# Patient Record
Sex: Female | Born: 1990 | Race: Black or African American | Hispanic: No | Marital: Single
Health system: Midwestern US, Community
[De-identification: ages and names within clinical notes are randomized; demographics above are authoritative.]

---

## 2011-10-21 IMAGING — CR RAD SPINE CERVICAL AP & LAT
3 series · 3 of 3 positions shown · non-contrast
Comparison: none

[AP (1 of 2)]
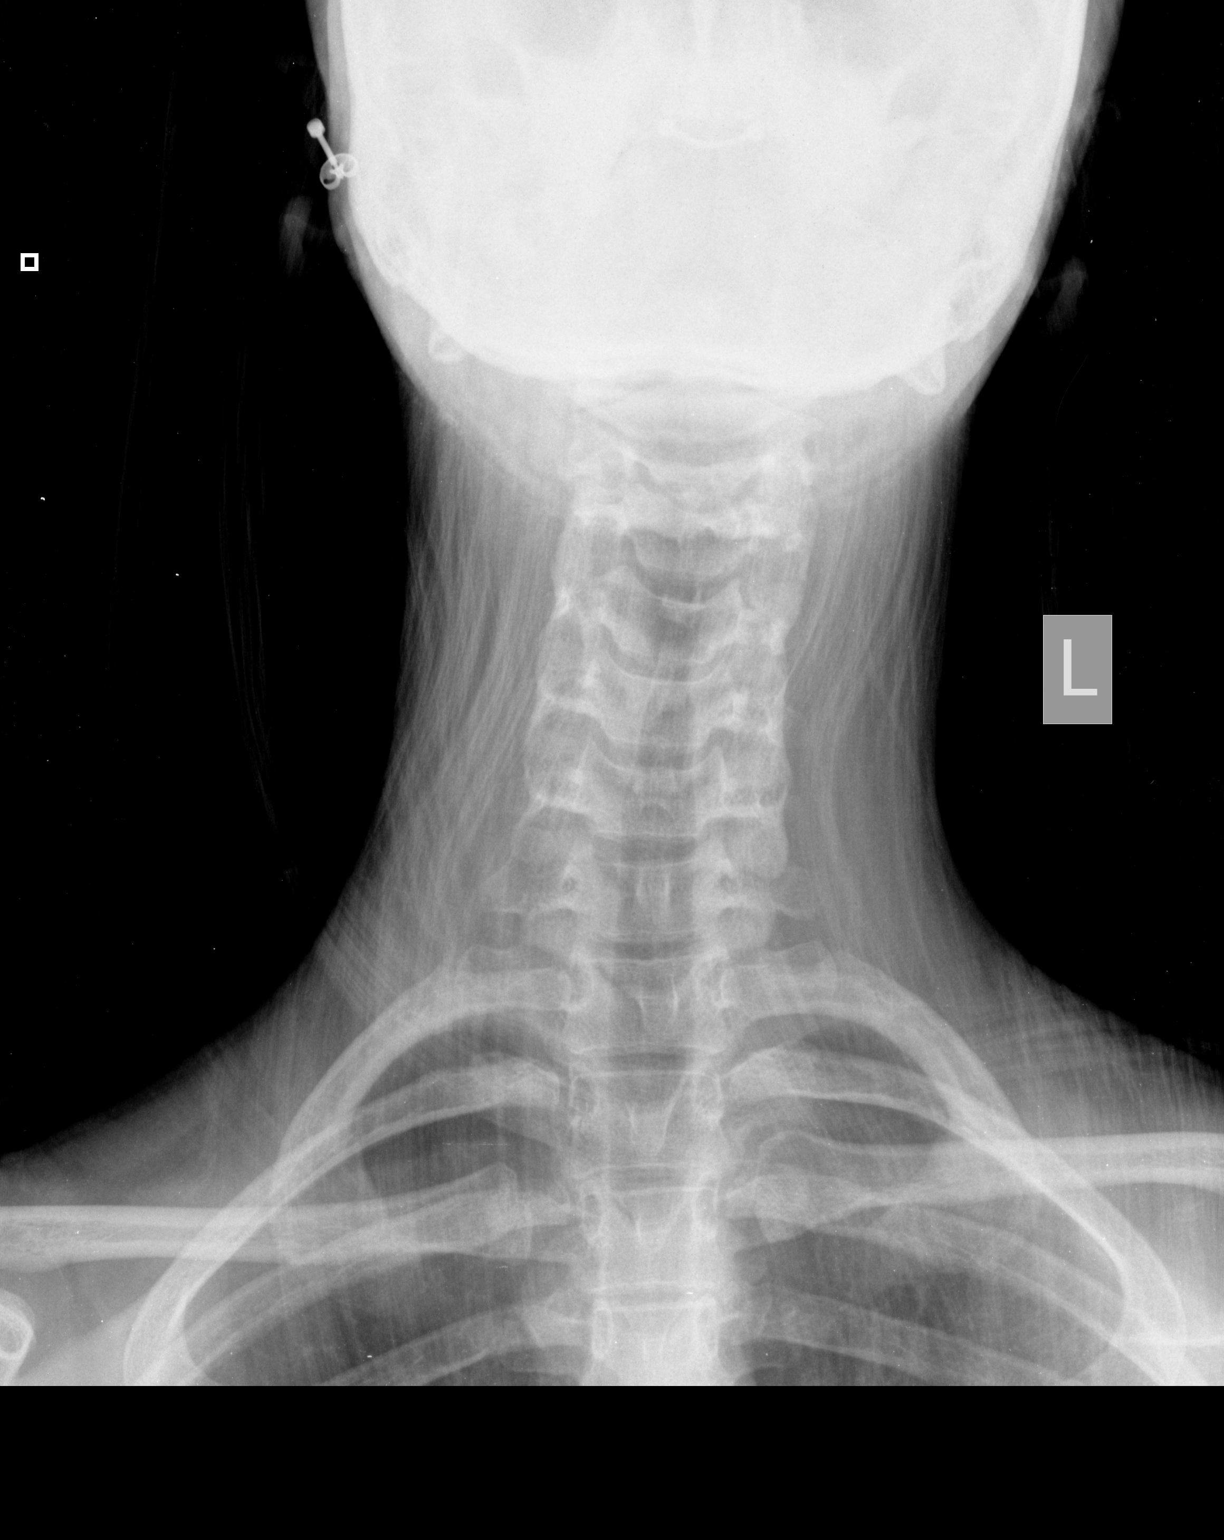

[AP (2 of 2)]
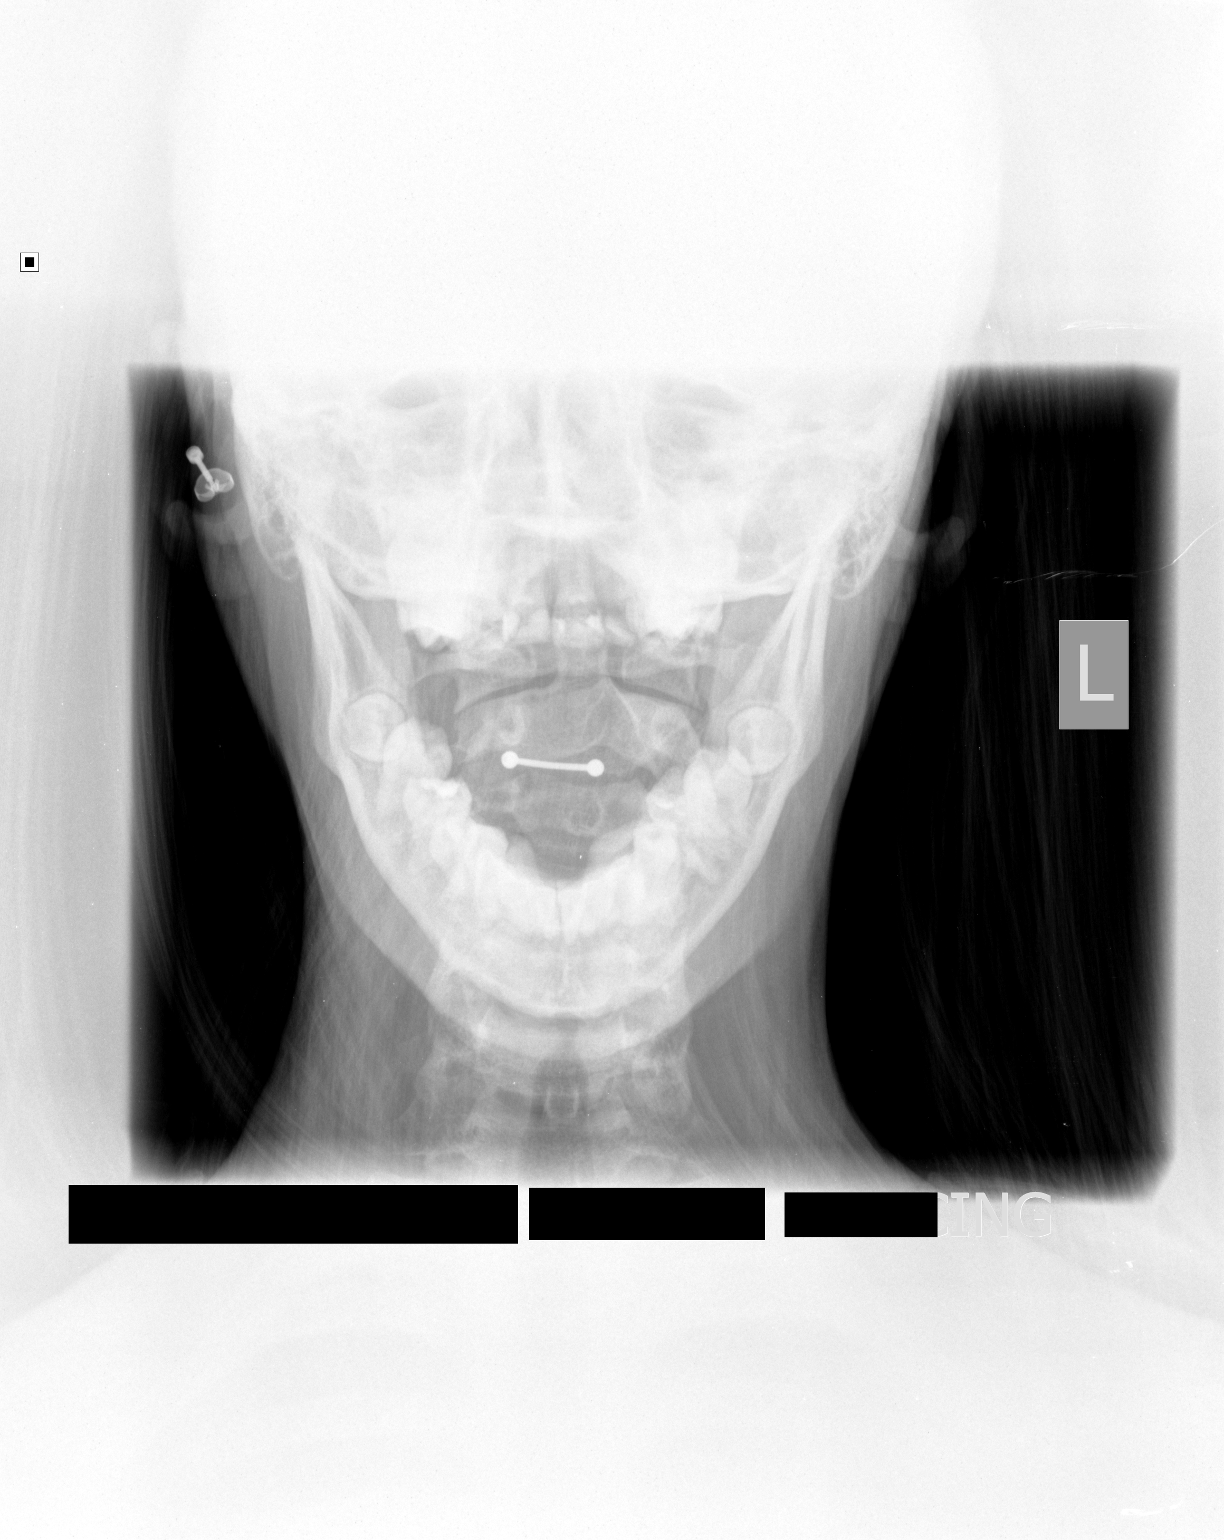

[left lateral]
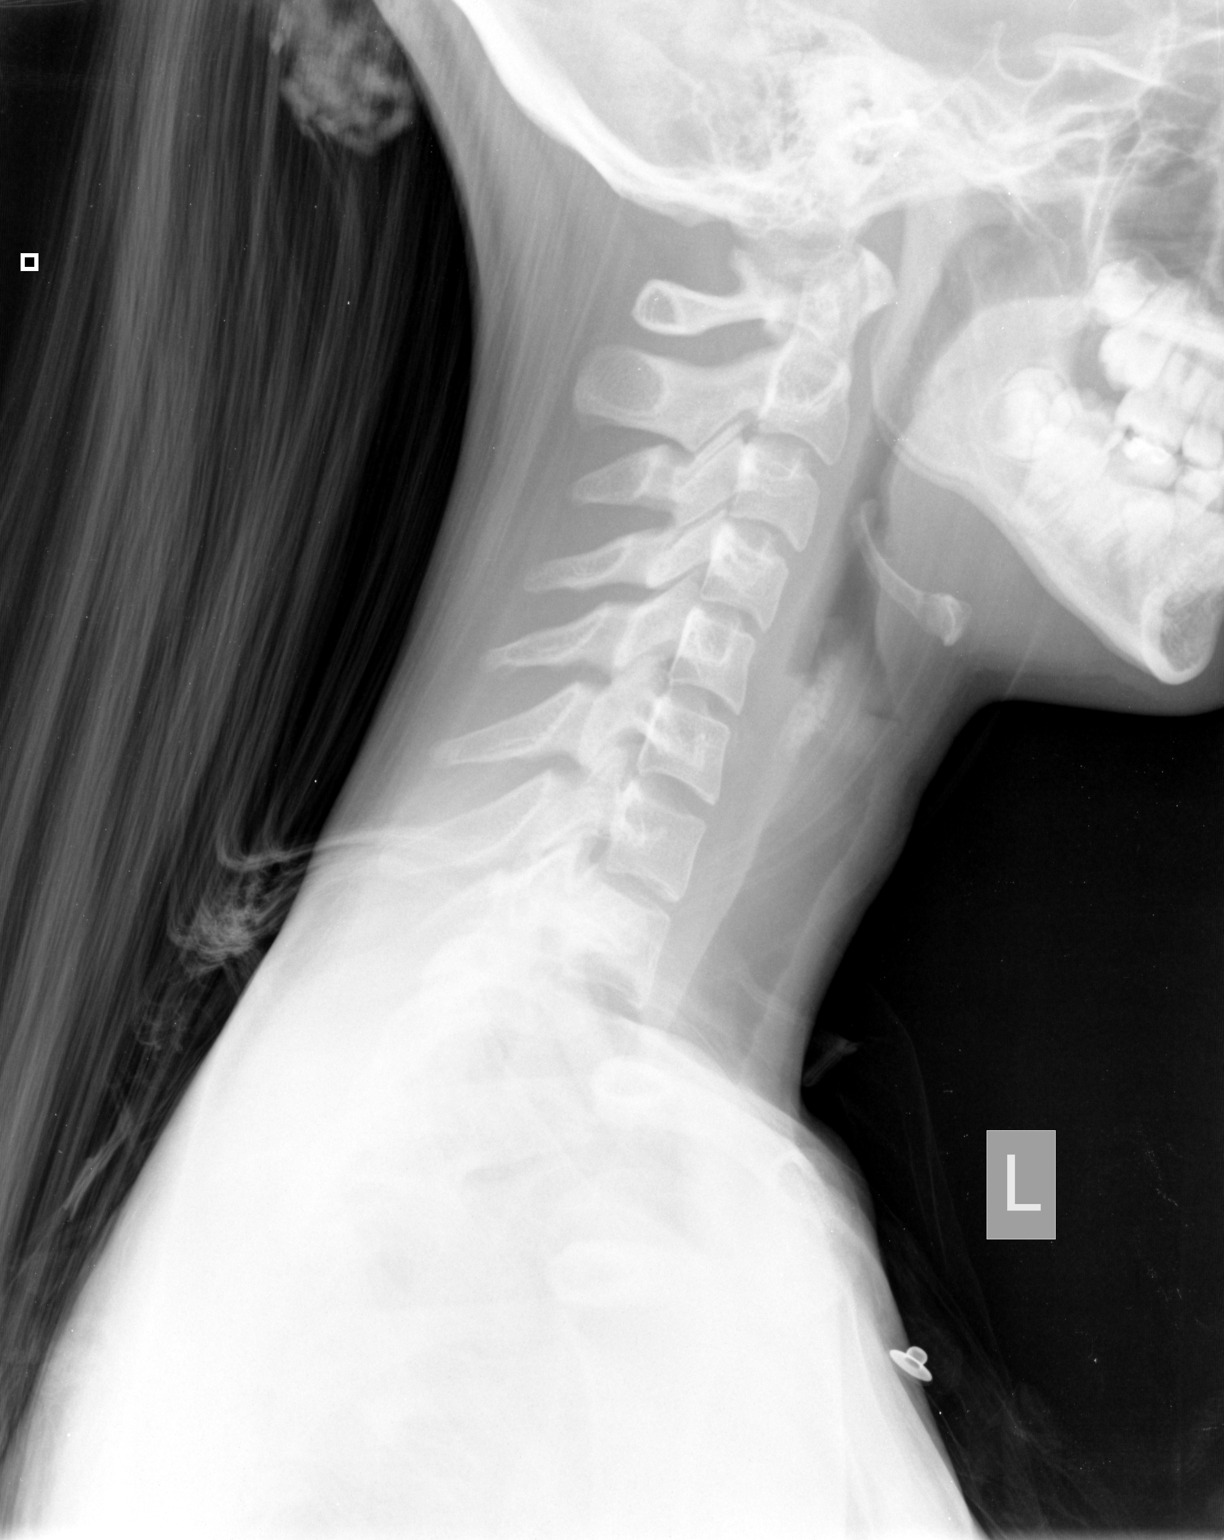

[3 of 3 positions shown; findings below may reference images not displayed]

CERVICAL SPINE-

There is no evidence of fracture, dislocation or other bony abnormality.

## 2013-05-28 NOTE — ED Notes (Signed)
Pt is [redacted] weeks pregnant and just moved here yest. Pt had prenatal care in [place] and isn't sure if she will find Dr. here or stay [place]. Pt states she had abd pain for approx 1hour this evening. States felt a "cramp". States had "small amount of blood" on tissue x1 tonight after voiding. No pain and no bleeding at this time.

## 2013-05-28 NOTE — ED Notes (Signed)
PT signed AMA and refusal of treatment form/ pt left in no distress with no pain or cramping and no vaginal drainage

## 2013-05-28 NOTE — ED Notes (Addendum)
PT refusing IV insertion and lab tests at this time after one attempt to obtain same/ Dr Theodis Aguasrope aware and speaking with patient

## 2013-05-29 LAB — URINE MICROSCOPIC

## 2013-05-29 LAB — URINALYSIS W/ RFLX MICROSCOPIC
Bilirubin: NEGATIVE
Blood: NEGATIVE
Glucose: NEGATIVE mg/dL
Ketone: NEGATIVE mg/dL
Nitrites: NEGATIVE
Protein: NEGATIVE mg/dL
Specific gravity: <1.005 — ABNORMAL LOW (ref 1.005–1.030)
Urobilinogen: 0.2 EU/dL (ref 0.1–1.0)
pH (UA): 6.5 (ref 4.5–8.0)

## 2013-05-29 MED ORDER — SODIUM CHLORIDE 0.9% BOLUS IV
0.9 % | Freq: Once | INTRAVENOUS | Status: DC
Start: 2013-05-29 — End: 2013-05-29

## 2013-05-29 MED FILL — SODIUM CHLORIDE 0.9 % IV: INTRAVENOUS | Qty: 1000

## 2013-05-29 NOTE — ED Notes (Signed)
[facility]   Dr Elsie Stainroshe will review the results and we will call pt and call in rx if needed

## 2013-05-29 NOTE — ED Notes (Signed)
Checking sensitivity so dr Elsie Stainroshe can decide on med for treatment

## 2013-05-29 NOTE — ED Provider Notes (Signed)
HPI Comments: 23 y/o female [redacted] weeks pregnant [place] and is complaining of abd cramps that resolved and episode of vaginal spotting when wiping. No weakness, no chest pain, no abd pain, no fever, no urinary complaints. Pt states she wants to go and doesn't want to wait for ob/gyn to call and does not want blood work. Here with grand mother    Patient is a 23 y.o. female presenting with pregnancy problem. The history is provided by the patient.   Pregnancy Problem  Associated symptoms include abdominal pain (resolved).        History reviewed. No pertinent past medical history.     History reviewed. No pertinent past surgical history.      History reviewed. No pertinent family history.     History     Social History   ??? Marital Status: SINGLE     Spouse Name: N/A     Number of Children: N/A   ??? Years of Education: N/A     Occupational History   ??? Not on file.     Social History Main Topics   ??? Smoking status: Never Smoker    ??? Smokeless tobacco: Not on file   ??? Alcohol Use: No   ??? Drug Use: No   ??? Sexually Active: Not on file     Other Topics Concern   ??? Not on file     Social History Narrative   ??? No narrative on file                  ALLERGIES: Review of patient's allergies indicates no known allergies.      Review of Systems   Constitutional: Negative.    HENT: Negative.    Eyes: Negative.    Respiratory: Negative.    Cardiovascular: Negative.    Gastrointestinal: Positive for abdominal pain (resolved).   Endocrine: Negative.    Genitourinary: Negative.    Musculoskeletal: Negative.    Skin: Negative.    Allergic/Immunologic: Negative.    Neurological: Negative.    Hematological: Negative.    Psychiatric/Behavioral: Negative.    All other systems reviewed and are negative.        Filed Vitals:    05/28/13 2145   BP: 98/66   Pulse: 100   Temp: 98.1 ??F (36.7 ??C)   Resp: 20   Height: 5\' 3"  (1.6 m)   Weight: 43.545 kg (96 lb)   SpO2:  100%            Physical Exam   Nursing note and vitals reviewed.  Constitutional: She is oriented to person, place, and time. She appears well-developed and well-nourished.   HENT:   Head: Normocephalic and atraumatic.   Right Ear: External ear normal.   Left Ear: External ear normal.   Eyes: Conjunctivae and EOM are normal. Pupils are equal, round, and reactive to light.   Neck: Normal range of motion. Neck supple.   Cardiovascular: Normal rate and regular rhythm.    Pulmonary/Chest: Effort normal and breath sounds normal.   Abdominal: Soft. Bowel sounds are normal.   Musculoskeletal: Normal range of motion.   Neurological: She is alert and oriented to person, place, and time.   Skin: Skin is warm.   Psychiatric: She has a normal mood and affect.        MDM    Procedures    <EMERGENCY DEPARTMENT CASE SUMMARY>  Impression/Differential Diagnosis: pregnancy    Plan: labs, observation, urine analysis/cutlure/ ultrasaound- ob/gyn consult    ED Course: unevntful    Final Impression/Diagnosis: threatened abortion/ pregnancy    Patient condition at time of disposition: stable    Pt AAO times three. Explained to pt and family member- grandmother risk, alternative, benefits fo leaving. Pt understands the risk of deatha nd or debilitation to mom and child if leaving wtihout medical evalutiona nd ob/gyn evalution. Pt and grandmother understands and still wants to gol told to f/u with ob'gyn- Dr. Jonette Pesakotikela in 24 hours. Told to return to er if symptoms worsen.Langston Masker/recur  Ria CommentShlomo Lenorris Karger, DO        I have reviewed the following home medications:    Prior to Admission medications    Medication Sig Start Date End Date Taking? Authorizing Provider   OTHER Prenatals daily   Yes Phys Other, MD         Ria CommentShlomo Longino Trefz, DO

## 2013-05-30 LAB — CULTURE, URINE

## 2013-05-31 NOTE — ED Notes (Signed)
Final urine culture report rec'd. Pt signed out AMA from ED. No antibiotics given from ED. Dr. Roshe advises Keflex $RemoveBeforeElsie [id number] [place] as requested by pt.

## 2015-07-28 ENCOUNTER — Inpatient Hospital Stay
Admit: 2015-07-28 | Discharge: 2015-07-28 | Disposition: A | Discharge status: Home / Self Care | Payer: BLUE CROSS/BLUE SHIELD | Attending: Emergency Medical Services

## 2015-07-28 DIAGNOSIS — R112 Nausea with vomiting, unspecified: Secondary | ICD-10-CM

## 2015-07-28 LAB — URINE MICROSCOPIC: Epithelial cells: >100 /hpf (ref 0–10)

## 2015-07-28 LAB — METABOLIC PANEL, COMPREHENSIVE
A-G Ratio: 1.1 (ref 1.0–1.5)
ALT (SGPT): 18 U/L (ref 12–78)
AST (SGOT): 16 U/L (ref 15–37)
Albumin: 4.1 g/dL (ref 3.4–5.0)
Alk. phosphatase: 55 U/L (ref 46–116)
Anion gap: 9 mmol/L (ref 8–20)
BUN: 8 mg/dL (ref 7–18)
Bilirubin, total: 0.7 mg/dL (ref 0.2–1.0)
CO2: 28 mmol/L (ref 21–32)
Calcium: 9 mg/dL (ref 8.5–10.1)
Chloride: 104 mmol/L (ref 98–107)
Creatinine: 0.91 mg/dL (ref 0.55–1.02)
GFR est AA: >60 mL/min/{1.73_m2} (ref >60)
GFR est non-AA: >60 mL/min/{1.73_m2} (ref >60)
Globulin: 3.9 g/dL (ref 2.5–5.0)
Glucose: 105 mg/dL (ref 74–106)
Potassium: 3.3 mmol/L — ABNORMAL LOW (ref 3.5–5.1)
Protein, total: 8 g/dL (ref 6.4–8.2)
Sodium: 141 mmol/L (ref 136–145)

## 2015-07-28 LAB — URINALYSIS W/ RFLX MICROSCOPIC
Bilirubin: NEGATIVE
Glucose: NEGATIVE mg/dL
Ketone: NEGATIVE mg/dL
Leukocyte Esterase: NEGATIVE
Nitrites: NEGATIVE
Protein: NEGATIVE mg/dL
Specific gravity: 1.01 (ref 1.005–1.030)
Urobilinogen: 0.2 EU/dL (ref 0.1–1.0)
pH (UA): 6 (ref 4.5–8.0)

## 2015-07-28 LAB — CBC WITH AUTOMATED DIFF
ABS. BASOPHILS: 0 10*3/uL (ref 0.0–0.1)
ABS. EOSINOPHILS: 0 10*3/uL (ref 0.0–0.2)
ABS. LYMPHOCYTES: 1 10*3/uL (ref 1.0–5.5)
ABS. MONOCYTES: 0.3 10*3/uL (ref 0.1–1.0)
ABS. NEUTROPHILS: 2.7 10*3/uL (ref 2.0–8.1)
BASOPHILS: 0 % (ref 0.0–1.0)
EOSINOPHILS: 0 % (ref 0.0–2.0)
HCT: 39.7 % (ref 37.0–47.0)
HGB: 13.2 g/dL (ref 12.0–16.0)
LYMPHOCYTES: 25 % (ref 20.5–51.1)
MCH: 32 PG — ABNORMAL HIGH (ref 27.0–31.0)
MCHC: 33.2 g/dL (ref 30.5–36.0)
MCV: 96.1 FL (ref 81.0–100.0)
MONOCYTES: 8 % (ref 1.7–10.0)
MPV: 10.3 FL (ref 10.2–12.7)
NEUTROPHILS: 67 % (ref 42.2–75.2)
PLATELET: 206 10*3/uL (ref 122–400)
RBC: 4.13 M/uL — ABNORMAL LOW (ref 4.20–5.40)
RDW: 11.9 % (ref 11.4–14.6)
WBC: 4 10*3/uL — ABNORMAL LOW (ref 4.8–10.8)

## 2015-07-28 LAB — HCG URINE, QL: HCG urine, QL: NEGATIVE

## 2015-07-28 MED ORDER — METRONIDAZOLE 0.75 % VAGINAL GEL
0.75 % (37.5mg/5 gram) | Freq: Every evening | VAGINAL | 0 refills | Status: AC
Start: 2015-07-28 — End: 2015-08-02

## 2015-07-28 MED ORDER — POTASSIUM CHLORIDE SR 20 MEQ TAB, PARTICLES/CRYSTALS
20 mEq | Freq: Once | ORAL | Status: AC
Start: 2015-07-28 — End: 2015-07-28
  Administered 2015-07-28: 22:00:00 via ORAL

## 2015-07-28 MED ORDER — ONDANSETRON (PF) 4 MG/2 ML INJECTION
4 mg/2 mL | INTRAMUSCULAR | Status: AC
Start: 2015-07-28 — End: 2015-07-28
  Administered 2015-07-28: 20:00:00 via INTRAVENOUS

## 2015-07-28 MED ORDER — SODIUM CHLORIDE 0.9 % IV
Freq: Once | INTRAVENOUS | Status: AC
Start: 2015-07-28 — End: 2015-07-28
  Administered 2015-07-28: 20:00:00 via INTRAVENOUS

## 2015-07-28 MED FILL — SODIUM CHLORIDE 0.9 % IV: INTRAVENOUS | Qty: 1000

## 2015-07-28 MED FILL — ONDANSETRON (PF) 4 MG/2 ML INJECTION: 4 mg/2 mL | INTRAMUSCULAR | Qty: 2

## 2015-07-28 MED FILL — KLOR-CON M20 MEQ TABLET,EXTENDED RELEASE: 20 mEq | ORAL | Qty: 1

## 2015-07-28 NOTE — ED Provider Notes (Signed)
Patient is a 25 y.o. female presenting with nausea. The history is provided by the patient.   Nausea    This is a new problem. The current episode started yesterday. The problem occurs 2 to 4 times per day. The problem has not changed since onset.The emesis has an appearance of stomach contents. There has been no fever. Pertinent negatives include no chills, no fever, no abdominal pain and no diarrhea. Associated symptoms comments: One loose stool. Risk factors include new medication (taking oral flagyl for bacterial vaginitis).        History reviewed. No pertinent past medical history.    History reviewed. No pertinent surgical history.      History reviewed. No pertinent family history.    Social History     Social History   ??? Marital status: SINGLE     Spouse name: N/A   ??? Number of children: N/A   ??? Years of education: N/A     Occupational History   ??? Not on file.     Social History Main Topics   ??? Smoking status: Never Smoker   ??? Smokeless tobacco: Not on file   ??? Alcohol use No   ??? Drug use: No   ??? Sexual activity: Not on file     Other Topics Concern   ??? Not on file     Social History Narrative         ALLERGIES: Review of patient's allergies indicates no known allergies.    Review of Systems   Constitutional: Positive for activity change, appetite change and fatigue. Negative for chills, diaphoresis and fever.   HENT: Negative.    Respiratory: Negative.    Gastrointestinal: Positive for nausea and vomiting. Negative for abdominal pain, blood in stool, constipation and diarrhea.   Endocrine: Negative.    Genitourinary: Positive for vaginal discharge. Negative for difficulty urinating, dysuria, flank pain, frequency, genital sores, hematuria and pelvic pain.        LMP 07/17/15, brownish vaginal discharge   Musculoskeletal: Negative.    Skin: Negative for color change, pallor and rash.   Allergic/Immunologic: Negative for immunocompromised state.   Neurological: Negative for dizziness and syncope.    All other systems reviewed and are negative.      Vitals:    07/28/15 1518 07/28/15 1522   BP:  108/72   Pulse:  94   Resp:  16   Temp:  98.6 ??F (37 ??C)   SpO2:  98%   Weight: 40.8 kg (90 lb)    Height:  (1.6 m)             Physical Exam   Constitutional: She is oriented to person, place, and time. She appears well-developed and well-nourished. No distress.   HENT:   Head: Normocephalic and atraumatic.   Mouth/Throat: Oropharynx is clear and moist.   Eyes: Conjunctivae and EOM are normal. Pupils are equal, round, and reactive to light. No scleral icterus.   Neck: Normal range of motion. Neck supple.   Cardiovascular: Normal rate, regular rhythm, normal heart sounds and intact distal pulses.    Pulmonary/Chest: Effort normal and breath sounds normal.   Abdominal: Soft. Normal appearance and bowel sounds are normal. She exhibits no shifting dullness, no distension, no pulsatile liver, no fluid wave, no abdominal bruit, no ascites, no pulsatile midline mass and no mass. There is no hepatosplenomegaly. There is no tenderness. There is no rigidity, no rebound, no guarding and no CVA tenderness.   Musculoskeletal: Normal  range of motion.   Neurological: She is alert and oriented to person, place, and time.   Skin: Skin is warm and dry. No rash noted. She is not diaphoretic. No erythema. No pallor.   Psychiatric: She has a normal mood and affect. Her behavior is normal. Thought content normal.   Nursing note and vitals reviewed.       MDM  Number of Diagnoses or Management Options  BV (bacterial vaginosis):   Hypokalemia:   Non-dose-related adverse reaction to medication, initial encounter:   Non-intractable vomiting with nausea, unspecified vomiting type:   Diagnosis management comments: 25 yo female with onset of n/v after taking oral flagyl for bacterial vaginitis.  No fever/pelvic pain/abdominal pain.  No urinary symptoms.  One loose stool, nonbloody.  Exam with benign  abdominal exam nontender, no guarding.  Afebrile.  Normal vital signs, afebrile.      ED Course:    Labs, IVFs, zofran, urine preg  Reassess      5:37 PM  Feeling much better  Given oral K for mild hypokalemia  Discontinue oral flagyl - transvaginal metrogel if any persistent symptoms  Progress diet as tolerated    ED Course       Procedures

## 2015-07-28 NOTE — ED Notes (Signed)
Pt reports N+V and "loose" bowel movements for about 4 days. Denies fevers. Also states that she was treated for bacterial vaginosis and was on an antibiotic which she finished 2 days ago

## 2015-07-28 NOTE — ED Notes (Signed)
Discharge instructions reviewed with patient. Patient states understanding of discharge instructions, follow up care and prescriptions. Questions encouraged and answered.

## 2015-10-10 DIAGNOSIS — E860 Dehydration: Secondary | ICD-10-CM

## 2015-10-10 NOTE — ED Triage Notes (Addendum)
Pt states she feels fatigued for the past day, pt states she has been drinking fluids but she still feels dehydrated pt states "my veins are usually puffy and they are not as big now". Pt is alert and oriented x4.

## 2015-10-11 ENCOUNTER — Inpatient Hospital Stay
Admit: 2015-10-11 | Discharge: 2015-10-11 | Disposition: A | Discharge status: Home / Self Care | Payer: BLUE CROSS/BLUE SHIELD | Attending: Emergency Medicine

## 2015-10-11 LAB — CBC WITH AUTOMATED DIFF
ABS. BASOPHILS: 0 10*3/uL (ref 0.0–0.1)
ABS. EOSINOPHILS: 0 10*3/uL (ref 0.0–0.2)
ABS. LYMPHOCYTES: 2.3 10*3/uL (ref 1.0–5.5)
ABS. MONOCYTES: 0.5 10*3/uL (ref 0.1–1.0)
ABS. NEUTROPHILS: 3.6 10*3/uL (ref 2.0–8.1)
BASOPHILS: 0 % (ref 0.0–1.0)
EOSINOPHILS: 1 % (ref 0.0–2.0)
HCT: 35.4 % — ABNORMAL LOW (ref 37.0–47.0)
HGB: 11.8 g/dL — ABNORMAL LOW (ref 12.0–16.0)
LYMPHOCYTES: 35 % (ref 20.5–51.1)
MCH: 32.6 PG — ABNORMAL HIGH (ref 27.0–31.0)
MCHC: 33.3 g/dL (ref 30.5–36.0)
MCV: 97.8 FL (ref 81.0–100.0)
MONOCYTES: 8 % (ref 1.7–10.0)
MPV: 10.7 FL (ref 10.2–12.7)
NEUTROPHILS: 56 % (ref 42.2–75.2)
PLATELET: 186 10*3/uL (ref 122–400)
RBC: 3.62 M/uL — ABNORMAL LOW (ref 4.20–5.40)
RDW: 11.5 % (ref 11.4–14.6)
WBC: 6.4 10*3/uL (ref 4.8–10.8)

## 2015-10-11 LAB — URINE MICROSCOPIC

## 2015-10-11 LAB — URINALYSIS W/ RFLX MICROSCOPIC
Bilirubin: NEGATIVE
Glucose: NEGATIVE mg/dL
Ketone: NEGATIVE mg/dL
Leukocyte Esterase: NEGATIVE
Nitrites: NEGATIVE
Protein: NEGATIVE mg/dL
Specific gravity: <1.005 — ABNORMAL LOW (ref 1.005–1.030)
Urobilinogen: 0.2 EU/dL (ref 0.1–1.0)
pH (UA): 6.5 (ref 4.5–8.0)

## 2015-10-11 LAB — METABOLIC PANEL, COMPREHENSIVE
A-G Ratio: 1.1 (ref 1.0–1.5)
ALT (SGPT): 19 U/L (ref 12–78)
AST (SGOT): 16 U/L (ref 15–37)
Albumin: 3.8 g/dL (ref 3.4–5.0)
Alk. phosphatase: 48 U/L (ref 46–116)
Anion gap: 9 mmol/L (ref 8–20)
BUN: 9 mg/dL (ref 7–18)
Bilirubin, total: 1.1 mg/dL — ABNORMAL HIGH (ref 0.2–1.0)
CO2: 28 mmol/L (ref 21–32)
Calcium: 9 mg/dL (ref 8.5–10.1)
Chloride: 106 mmol/L (ref 98–107)
Creatinine: 0.79 mg/dL (ref 0.55–1.02)
GFR est AA: >60 mL/min/{1.73_m2} (ref >60)
GFR est non-AA: >60 mL/min/{1.73_m2} (ref >60)
Globulin: 3.4 g/dL (ref 2.5–5.0)
Glucose: 91 mg/dL (ref 74–106)
Potassium: 3.7 mmol/L (ref 3.5–5.1)
Protein, total: 7.2 g/dL (ref 6.4–8.2)
Sodium: 142 mmol/L (ref 136–145)

## 2015-10-11 LAB — MAGNESIUM: Magnesium: 2 mg/dL (ref 1.8–2.4)

## 2015-10-11 LAB — HCG URINE, QL: HCG urine, QL: NEGATIVE

## 2015-10-11 MED ORDER — SODIUM CHLORIDE 0.9 % IJ SYRG
Freq: Three times a day (TID) | INTRAMUSCULAR | Status: DC
Start: 2015-10-11 — End: 2015-10-11
  Administered 2015-10-11: 05:00:00 via INTRAVENOUS

## 2015-10-11 MED ORDER — SODIUM CHLORIDE 0.9 % IJ SYRG
INTRAMUSCULAR | Status: DC | PRN
Start: 2015-10-11 — End: 2015-10-11

## 2015-10-11 MED ORDER — ONDANSETRON (PF) 4 MG/2 ML INJECTION
4 mg/2 mL | Freq: Once | INTRAMUSCULAR | Status: DC
Start: 2015-10-11 — End: 2015-10-11

## 2015-10-11 MED ORDER — SODIUM CHLORIDE 0.9% BOLUS IV
0.9 % | Freq: Once | INTRAVENOUS | Status: AC
Start: 2015-10-11 — End: 2015-10-11
  Administered 2015-10-11: 05:00:00 via INTRAVENOUS

## 2015-10-11 MED ORDER — SODIUM CHLORIDE 0.9 % IV
INTRAVENOUS | Status: DC
Start: 2015-10-11 — End: 2015-10-11

## 2015-10-11 MED ORDER — ACETAMINOPHEN 325 MG TABLET
325 mg | ORAL | Status: AC
Start: 2015-10-11 — End: 2015-10-11
  Administered 2015-10-11: 05:00:00 via ORAL

## 2015-10-11 MED FILL — NORMAL SALINE FLUSH 0.9 % INJECTION SYRINGE: INTRAMUSCULAR | Qty: 10

## 2015-10-11 MED FILL — TYLENOL 325 MG TABLET: 325 mg | ORAL | Qty: 2

## 2015-10-11 MED FILL — SODIUM CHLORIDE 0.9 % IV: INTRAVENOUS | Qty: 1000

## 2015-10-11 MED FILL — SODIUM CHLORIDE 0.9 % IV: INTRAVENOUS | Qty: 500

## 2015-10-11 MED FILL — ONDANSETRON (PF) 4 MG/2 ML INJECTION: 4 mg/2 mL | INTRAMUSCULAR | Qty: 2

## 2015-10-11 NOTE — ED Provider Notes (Addendum)
HPI Comments: This patient presents with a h/o feeling some generalized weakness, fatigue, nausea, and not eating or drinking as much as usual because she has been working so much. She feels thirsty and dehydrated with a slight dull headache. She has been able to tolerate fluids and no vomiting, diarrhea, fever chills or chest or abdominal pain. No photophobia, neck pain or stiffness or rash.  No recent travel.  She was given a liter of IV fluids at an urgent care yesterday.     Patient is a 25 y.o. female presenting with dehydration. The history is provided by the patient.   Dehydration   Associated symptoms include headaches. Pertinent negatives include no chest pain, no abdominal pain and no shortness of breath.        History reviewed. No pertinent past medical history.    Past Surgical History:   Procedure Laterality Date   ??? HX GYN      vaginal birth 2 years ago         History reviewed. No pertinent family history.    Social History     Social History   ??? Marital status: SINGLE     Spouse name: N/A   ??? Number of children: N/A   ??? Years of education: N/A     Occupational History   ??? Not on file.     Social History Main Topics   ??? Smoking status: Never Smoker   ??? Smokeless tobacco: Not on file   ??? Alcohol use Yes      Comment: socially   ??? Drug use: No   ??? Sexual activity: Not on file     Other Topics Concern   ??? Not on file     Social History Narrative         ALLERGIES: Review of patient's allergies indicates no known allergies.    Review of Systems   Constitutional: Positive for activity change (working more than usual) and fatigue. Negative for chills and fever.   HENT: Negative for rhinorrhea and sore throat.    Eyes: Negative for photophobia, pain and visual disturbance.   Respiratory: Negative for cough, chest tightness and shortness of breath.    Cardiovascular: Negative for chest pain and palpitations.   Gastrointestinal: Positive for nausea. Negative for abdominal distention,  abdominal pain, anal bleeding, blood in stool, diarrhea and vomiting.   Genitourinary: Negative for dysuria, flank pain, frequency, hematuria and urgency.   Musculoskeletal: Negative for back pain, neck pain and neck stiffness.   Neurological: Positive for light-headedness and headaches. Negative for dizziness, syncope and numbness.   Psychiatric/Behavioral: Negative for confusion and decreased concentration.       Vitals:    10/10/15 2315 10/10/15 2316   BP:  118/80   Pulse:  84   Resp:  18   Temp:  98.3 ??F (36.8 ??C)   SpO2:  100%   Weight: 40.8 kg (90 lb)    Height:  (1.6 m)             Physical Exam   Constitutional: She is oriented to person, place, and time. She appears well-developed and well-nourished. No distress.   HENT:   Nose: Nose normal.   Mouth/Throat: Oropharynx is clear and moist. No oropharyngeal exudate.   Mucous membranes moist tongue moist   No hypopharyngeal erythema   Eyes: Conjunctivae are normal. Right eye exhibits no discharge. Left eye exhibits no discharge. No scleral icterus.   Neck: Neck supple. No JVD present. No  tracheal deviation present.   Cardiovascular: Normal rate, regular rhythm and normal heart sounds.  Exam reveals no gallop and no friction rub.    No murmur heard.  Pulmonary/Chest: Effort normal and breath sounds normal. No stridor. No respiratory distress. She has no wheezes. She has no rales. She exhibits no tenderness.   Abdominal: Soft. Bowel sounds are normal. She exhibits no distension and no mass. There is no tenderness. There is no rebound and no guarding.   Genitourinary:   Genitourinary Comments: No cva or flank tenderness   Musculoskeletal: She exhibits no edema or tenderness.   Lymphadenopathy:     She has no cervical adenopathy.   Neurological: She is alert and oriented to person, place, and time.   Skin: Skin is warm and dry. No rash noted. She is not diaphoretic. No erythema. No pallor.   Psychiatric: She has a normal mood and affect.    Nursing note and vitals reviewed.       MDM  ED Course       Procedures    Labs Reviewed   CBC WITH AUTOMATED DIFF - Abnormal; Notable for the following:        Result Value    RBC 3.62 (*)     HGB 11.8 (*)     HCT 35.4 (*)     MCH 32.6 (*)     All other components within normal limits   METABOLIC PANEL, COMPREHENSIVE - Abnormal; Notable for the following:     Bilirubin, total 1.1 (*)     All other components within normal limits   URINALYSIS W/ RFLX MICROSCOPIC - Abnormal; Notable for the following:     Specific gravity <1.005 (*)     Blood TRACE (*)     All other components within normal limits   MAGNESIUM   HCG URINE, QL   URINE MICROSCOPIC     She has no nausea now and does not want the zofran.     <EMERGENCY DEPARTMENT CASE SUMMARY>    Impression/Differential Diagnosis: dehydration consider electrolyte imbalance, anemia, pregnancy    Plan: exam, work up as ordered, re-evaluation    ED Course: improved    Final Impression/Diagnosis: mild dehydration    Patient condition at time of disposition: stable      I have reviewed the following home medications:    Prior to Admission medications    Not on File         Collier Salinaobert E Hennessy Bartel, MD

## 2015-10-11 NOTE — ED Notes (Signed)
Patient is awake, alert, and oriented, speech is clear and patient is able to ambulate and ready for discharge. Verbal and written discharge instructions provided and has the cognitive understanding of discharge instructions. Discharged home with family. All questions answered.

## 2015-12-05 ENCOUNTER — Inpatient Hospital Stay
Admit: 2015-12-05 | Discharge: 2015-12-05 | Disposition: A | Discharge status: Home / Self Care | Payer: BLUE CROSS/BLUE SHIELD | Attending: Emergency Medicine

## 2015-12-05 DIAGNOSIS — K0889 Other specified disorders of teeth and supporting structures: Secondary | ICD-10-CM

## 2015-12-05 MED ORDER — IBUPROFEN 800 MG TAB
800 mg | ORAL | Status: AC
Start: 2015-12-05 — End: 2015-12-05
  Administered 2015-12-05: 18:00:00 via ORAL

## 2015-12-05 MED ORDER — PENICILLIN V-K 500 MG TAB
500 mg | ORAL_TABLET | Freq: Four times a day (QID) | ORAL | 0 refills | Status: AC
Start: 2015-12-05 — End: 2015-12-12

## 2015-12-05 MED ORDER — PENICILLIN V-K 500 MG TAB
500 mg | ORAL | Status: AC
Start: 2015-12-05 — End: 2015-12-05
  Administered 2015-12-05: 18:00:00 via ORAL

## 2015-12-05 MED FILL — IBUPROFEN 800 MG TAB: 800 mg | ORAL | Qty: 1

## 2015-12-05 MED FILL — PENICILLIN V-K 500 MG TAB: 500 mg | ORAL | Qty: 1

## 2015-12-05 NOTE — ED Notes (Signed)
Patient is awake, alert, and oriented, speech is clear and patient is able to ambulate (if applicable) and ready for discharge. Verbal and written discharge instructions provided and has the cognitive understanding of discharge instructions. Discharged home with family. All questions answered.

## 2015-12-05 NOTE — ED Provider Notes (Signed)
HPI Comments: PT WITH PAIN LEFT LOWER JAW FOR THE PAST 3 DAYS, THINKS SHE CRACKED A TOOTH. NO FEVER, CHILLS, CP, SOB. PAIN RADIATED TO LEFT EAR AND NECK. NO DIFFICULTY BREATHING SPEAKING OR SWALLOWING. FELT SOB LAST NIGHT, VERY STRESSED, SINGLE MOM    Patient is a 25 y.o. female presenting with jaw pain. The history is provided by the patient.   Jaw Pain   This is a new problem. The current episode started more than 2 days ago. The problem has not changed since onset.Pertinent negatives include no chest pain, no abdominal pain, no headaches and no shortness of breath. Nothing aggravates the symptoms.        History reviewed. No pertinent past medical history.    Past Surgical History:   Procedure Laterality Date   ??? HX GYN      vaginal birth 2 years ago         History reviewed. No pertinent family history.    Social History     Social History   ??? Marital status: SINGLE     Spouse name: N/A   ??? Number of children: N/A   ??? Years of education: N/A     Occupational History   ??? Not on file.     Social History Main Topics   ??? Smoking status: Never Smoker   ??? Smokeless tobacco: Never Used   ??? Alcohol use Yes      Comment: socially   ??? Drug use: No   ??? Sexual activity: Not on file     Other Topics Concern   ??? Not on file     Social History Narrative         ALLERGIES: Review of patient's allergies indicates no known allergies.    Review of Systems   Constitutional: Negative.  Negative for fever.   HENT: Negative.    Eyes: Negative.    Respiratory: Negative.  Negative for cough and shortness of breath.    Cardiovascular: Negative.  Negative for chest pain.   Gastrointestinal: Negative.  Negative for abdominal pain.   Genitourinary: Negative.    Musculoskeletal: Negative.    Skin: Negative.  Negative for rash.   Neurological: Negative.  Negative for headaches.   Psychiatric/Behavioral: Negative.    All other systems reviewed and are negative.      Vitals:    12/05/15 1341   BP: 94/67   Pulse: 84   Resp: 16    Temp: 99.2 ??F (37.3 ??C)   SpO2: 98%   Weight: 36.3 kg (80 lb)   Height: 5\' 3"  (1.6 m)            Physical Exam   Constitutional: She is oriented to person, place, and time. She appears well-developed and well-nourished.   HENT:   Head: Normocephalic and atraumatic.   Right Ear: External ear normal.   Left Ear: Tympanic membrane and external ear normal.   Nose: Nose normal.   Mouth/Throat: Uvula is midline, oropharynx is clear and moist and mucous membranes are normal. No trismus in the jaw. No uvula swelling. No oropharyngeal exudate, posterior oropharyngeal edema, posterior oropharyngeal erythema or tonsillar abscesses.       TTP LEFT LOWER POSTERIOR MOLAR/IMPACTED WISDOM TOOTH, NO GINGIVAL ABSCESS, DRAINAGE OR FACIAL SWELLING  R TM NOT SEEN - CERUMEN IMPACTION   Eyes: Conjunctivae are normal. Pupils are equal, round, and reactive to light.   Neck: Normal range of motion and full passive range of motion without pain. Neck supple. No  spinous process tenderness and no muscular tenderness present. Normal range of motion present.   Cardiovascular: Normal rate, regular rhythm and normal heart sounds.    Pulmonary/Chest: Effort normal.   Musculoskeletal: Normal range of motion.   Lymphadenopathy:        Head (left side): No submental, no submandibular, no preauricular, no posterior auricular and no occipital adenopathy present.     She has no cervical adenopathy.   THIN NECK APPEARS NORMAL, NON TENDER, NO ADENOPATHY   Neurological: She is alert and oriented to person, place, and time.   Skin: Skin is warm and dry.   Nursing note and vitals reviewed.       MDM  ED Course       Procedures      DENTAL PAIN WILL COVER WITH PCN, NSAIDS  F/U DENTAL RTC IF WORSE    <EMERGENCY DEPARTMENT CASE SUMMARY>    Impression/Differential Diagnosis: DENTAL PAIN, JAW PAIN, EARACHE    Plan: EXAM    ED Course: UNREMARKABLE    Final Impression/Diagnosis:   1. Pain, dental          Patient condition at time of disposition: STABLE       I have reviewed the following home medications:    Prior to Admission medications    Not on File         Andreas Blower, MD

## 2015-12-05 NOTE — ED Triage Notes (Signed)
Patient arrives to the ED with complaint of left jaw and neck pain x 3 days. Possible tooth issue.

## 2016-01-21 ENCOUNTER — Inpatient Hospital Stay
Admit: 2016-01-21 | Discharge: 2016-01-22 | Disposition: A | Discharge status: Home / Self Care | Payer: Self-pay | Attending: Emergency Medical Services

## 2016-01-21 ENCOUNTER — Emergency Department: Admit: 2016-01-21 | Payer: Self-pay | Primary: Family

## 2016-01-21 DIAGNOSIS — S0033XA Contusion of nose, initial encounter: Secondary | ICD-10-CM
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
## 2016-01-21 NOTE — ED Notes (Signed)
Patient is awake, alert, and oriented, speech is clear and patient is able to ambulate (if applicable) and ready for discharge. Verbal and written discharge instructions provided and has the cognitive understanding of discharge instructions. Discharged home with family. All questions answered. Pt discharged  No rx sent
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
## 2016-01-30 DIAGNOSIS — R51 Headache: Secondary | ICD-10-CM
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
## 2016-01-30 NOTE — ED Notes (Signed)
Patient is awake, alert, and oriented, speech is clear and patient is able to ambulate (if applicable) and ready for discharge. Verbal and written discharge instructions provided and has the cognitive understanding of discharge instructions. Discharged home with family. All questions answered.

## 2016-01-30 NOTE — ED Provider Notes (Signed)
HPI Comments:   Patient here for headache and post neck pain for couple days . Patient links her headache to a nose injury she had 10 days ago.. She took Motrin earlier today with some relief. No n/V, no Photophobia. She also reports  Feeling cold in her scalp. She is 84 lbs .    The history is provided by the patient. No language interpreter was used.        History reviewed. No pertinent past medical history.    Past Surgical History:   Procedure Laterality Date   ??? HX GYN      vaginal birth 2 years ago         History reviewed. No pertinent family history.    Social History     Social History   ??? Marital status: SINGLE     Spouse name: N/A   ??? Number of children: N/A   ??? Years of education: N/A     Occupational History   ??? Not on file.     Social History Main Topics   ??? Smoking status: Never Smoker   ??? Smokeless tobacco: Never Used   ??? Alcohol use Yes      Comment: socially   ??? Drug use: No   ??? Sexual activity: Not on file     Other Topics Concern   ??? Not on file     Social History Narrative         ALLERGIES: Review of patient's allergies indicates no known allergies.    Review of Systems   Respiratory: Negative for shortness of breath.    Cardiovascular: Negative for chest pain.   Endocrine:        Feels cold in the head   Neurological: Positive for headaches.   All other systems reviewed and are negative.      Vitals:    01/30/16 2151   BP: 107/74   Pulse: 95   Resp: 16   Temp: 98.2 ??F (36.8 ??C)   SpO2: 99%   Weight: 38.6 kg (85 lb)   Height: 5\' 3"  (1.6 m)            Physical Exam   Constitutional: She is oriented to person, place, and time. She appears well-developed and well-nourished. No distress.   Frail young girl. Underweight   HENT:   Head: Normocephalic and atraumatic.   Eyes: EOM are normal. Pupils are equal, round, and reactive to light.   Mild pallor of conjunctiva   Neck: Normal range of motion. Neck supple.   Cardiovascular: Normal rate and regular rhythm.     Pulmonary/Chest: Effort normal and breath sounds normal.   Abdominal: Soft. Bowel sounds are normal.   Musculoskeletal: Normal range of motion.   Neurological: She is alert and oriented to person, place, and time.   Skin: Skin is warm and dry.   Nursing note and vitals reviewed.       MDM  ED Course       Procedures    <EMERGENCY DEPARTMENT CASE SUMMARY>    Impression/Differential Diagnosis:   Underweight , r/o anemia , thyroid issues    Plan: Went to patient's bedside. Results discussed with patient. Patient is in NAD, resting comfortably at this time. Will discharge patient home in stable condition.       I instructed patient to f-up with Dr Vonzell Schlatter crystal run for outpatient eval      ED Course:   Orders Placed This Encounter   ??? acetaminophen (TYLENOL)  tablet 500 mg         Final Impression/Diagnosis:   1. Nonintractable headache, unspecified chronicity pattern, unspecified headache type          Patient condition at time of disposition:  fair      I have reviewed the following home medications:    Prior to Admission medications    Not on File         Lenis DickinsonPany Ladarryl Wrage, MD

## 2016-01-31 ENCOUNTER — Inpatient Hospital Stay
Admit: 2016-01-31 | Discharge: 2016-01-31 | Disposition: A | Discharge status: Home / Self Care | Payer: BLUE CROSS/BLUE SHIELD | Attending: Geriatric Medicine

## 2016-01-31 MED ORDER — ACETAMINOPHEN 500 MG TAB
500 mg | ORAL | Status: AC
Start: 2016-01-31 — End: 2016-01-30
  Administered 2016-01-31: 02:00:00 via ORAL

## 2016-01-31 MED FILL — TYLENOL EXTRA STRENGTH 500 MG TABLET: 500 mg | ORAL | Qty: 1

## 2016-05-14 ENCOUNTER — Inpatient Hospital Stay
Admit: 2016-05-14 | Discharge: 2016-05-15 | Disposition: A | Discharge status: Home / Self Care | Payer: BLUE CROSS/BLUE SHIELD | Attending: Emergency Medicine

## 2016-05-14 ENCOUNTER — Emergency Department: Admit: 2016-05-15 | Payer: BLUE CROSS/BLUE SHIELD | Primary: Family

## 2016-05-14 DIAGNOSIS — R0789 Other chest pain: Secondary | ICD-10-CM

## 2016-05-14 NOTE — ED Triage Notes (Signed)
Pt sts ever since she had her flu shot on 9/29 she has a little ches tpain and sts her period is 3 days late and would like a pregnancy test   Urine sample given   Pt smiling and comfortable  Denies sob or any diff breathing or moving   sts didn't see a doctor

## 2016-05-14 NOTE — ED Notes (Signed)
Patient is awake, alert, and oriented, speech is clear and patient is able to ambulate (if applicable) and ready for discharge. Verbal and written discharge instructions provided and has the cognitive understanding of discharge instructions. Discharged home with family. All questions answered. Pt discharged  No rx sent   Pt amb out of er with child

## 2016-05-14 NOTE — ED Provider Notes (Signed)
HPI Comments: This patient presents to the ED with a h/o having some aching right upper chest pain ever since she had a flu shot on 02/08/16.  Sometimes it is over the left mid to upper chest as well.  The pain is worse with movement and pressing on the area.  She denies SOB, nausea, vomiting diaphoresis, cough, sore throat, fever, chills, or h/o trauma. She also states her period has been three days late and is requesting a pregnancy test.  She  Has some mild nasal congestion. No h/o heart disease, diabetes, leg pain or swelling or h/o trauma.     Patient is a 26 y.o. female presenting with morning sickness. The history is provided by the patient.   Morning Sickness   Pertinent negatives include no chest pain, no abdominal pain, no headaches and no shortness of breath.        History reviewed. No pertinent past medical history.    Past Surgical History:   Procedure Laterality Date   ??? HX GYN      vaginal birth 2 years ago         History reviewed. No pertinent family history.    Social History     Social History   ??? Marital status: SINGLE     Spouse name: N/A   ??? Number of children: N/A   ??? Years of education: N/A     Occupational History   ??? Not on file.     Social History Main Topics   ??? Smoking status: Never Smoker   ??? Smokeless tobacco: Never Used   ??? Alcohol use Yes      Comment: socially   ??? Drug use: No   ??? Sexual activity: Not on file     Other Topics Concern   ??? Not on file     Social History Narrative         ALLERGIES: Review of patient's allergies indicates no known allergies.    Review of Systems   Constitutional: Negative for activity change, chills, diaphoresis, fatigue and fever.   HENT: Positive for congestion. Negative for rhinorrhea, sore throat, trouble swallowing and voice change.    Eyes: Negative for photophobia, pain and visual disturbance.   Respiratory: Negative for cough, chest tightness, shortness of breath and wheezing.     Cardiovascular: Negative for chest pain, palpitations and leg swelling.   Gastrointestinal: Negative for abdominal distention, abdominal pain, anal bleeding, blood in stool, diarrhea, nausea and vomiting.   Genitourinary: Negative for dysuria, flank pain and hematuria.   Musculoskeletal: Negative for arthralgias, myalgias, neck pain and neck stiffness.   Skin: Negative for pallor and rash.   Neurological: Negative for dizziness, weakness, numbness and headaches.   Psychiatric/Behavioral: Negative for confusion and decreased concentration.       Vitals:    05/14/16 1916 05/14/16 1922   BP: 100/65    Pulse: 80    Resp: 18    Temp: 98 ??F (36.7 ??C)    SpO2: 98% 98%   Weight: 40.4 kg (89 lb)    Height: 5\' 3"  (1.6 m)             Physical Exam   Constitutional: She is oriented to person, place, and time. She appears well-developed and well-nourished. No distress.   Patient is smiling in no distress   HENT:   Nose: Nose normal.   Mouth/Throat: Oropharynx is clear and moist. No oropharyngeal exudate.   Eyes: Conjunctivae are normal. Pupils are equal, round,  and reactive to light. Right eye exhibits no discharge. Left eye exhibits no discharge. No scleral icterus.   Neck: Normal range of motion. Neck supple. No JVD present. No tracheal deviation present. No thyromegaly present.   Cardiovascular: Normal rate, regular rhythm and normal heart sounds.  Exam reveals no gallop and no friction rub.    No murmur heard.  Pulmonary/Chest: Effort normal and breath sounds normal. No stridor. No respiratory distress. She has no wheezes. She has no rales. She exhibits tenderness (over the right sternoclavicular junction reproducing patient's pain).   Abdominal: Soft. Bowel sounds are normal. She exhibits no distension and no mass. There is no tenderness. There is no rebound and no guarding.   Genitourinary:   Genitourinary Comments: No cva or flank tenderness   Musculoskeletal: She exhibits no edema or tenderness.   Lymphadenopathy:      She has no cervical adenopathy.   Neurological: She is alert and oriented to person, place, and time. She has normal strength. Gait normal. GCS eye subscore is 4. GCS verbal subscore is 5. GCS motor subscore is 6.   Skin: Skin is warm. No rash noted. She is not diaphoretic. No erythema. No pallor.   moist   Psychiatric: She has a normal mood and affect.   Nursing note and vitals reviewed.       MDM  ED Course       Procedures      Urine pregnancy negative    She would like some Motrin or pain.          EXAM:    XR Chest, 2 Views  ??  EXAM DATE/TIME:    05/14/2016 8:33 PM  ??  CLINICAL HISTORY:    26 years old, female; Chest pain  ??  TECHNIQUE:    Frontal and lateral views of the chest.  ??  COMPARISON:    No relevant prior studies available.  ??  FINDINGS:    Lungs:  Unremarkable.  No consolidation.    Pleural space:  Unremarkable.  No pneumothorax.    Heart:  Unremarkable.  No cardiomegaly.    Mediastinum:  Unremarkable.    Bones/joints:  Unremarkable.  ??  IMPRESSION  IMPRESSION:         There are no acute concerning abnormalities.   ??  THIS DOCUMENT HAS BEEN ELECTRONICALLY SIGNED BY Tamsen Roers MD   ??     9:21 PM  Patient feels somewhat improved from the motrin no new symptoms    <EMERGENCY DEPARTMENT CASE SUMMARY>    Impression/Differential Diagnosis: chest pain consider musculoskeletal pain, pneumonia. Pleuritis, flu    Plan: exam, work up as ordered    ED Course: somewhat imropoved    Final Impression/Diagnosis: musculoskeletal chest pain    Patient condition at time of disposition: stable      I have reviewed the following home medications:    Prior to Admission medications    Not on File         Collier Salina, MD

## 2016-05-15 LAB — HCG URINE, QL: HCG urine, QL: NEGATIVE

## 2016-05-15 MED ORDER — ALUM-MAG HYDROXIDE-SIMETH 200 MG-200 MG-20 MG/5 ML ORAL SUSP
200-200-20 mg/5 mL | ORAL | Status: AC
Start: 2016-05-15 — End: 2016-05-14
  Administered 2016-05-15: 02:00:00 via ORAL

## 2016-05-15 MED ORDER — IBUPROFEN 600 MG TAB
600 mg | ORAL | Status: AC
Start: 2016-05-15 — End: 2016-05-14
  Administered 2016-05-15: 02:00:00 via ORAL

## 2016-05-15 MED FILL — MAG-AL PLUS 200 MG-200 MG-20 MG/5 ML ORAL SUSPENSION: 200-200-20 mg/5 mL | ORAL | Qty: 30

## 2016-05-15 MED FILL — IBUPROFEN 600 MG TAB: 600 mg | ORAL | Qty: 1

## 2016-09-15 ENCOUNTER — Inpatient Hospital Stay
Admit: 2016-09-15 | Discharge: 2016-09-15 | Disposition: A | Discharge status: Home / Self Care | Payer: BLUE CROSS/BLUE SHIELD | Attending: Emergency Medicine

## 2016-09-15 ENCOUNTER — Emergency Department: Admit: 2016-09-15 | Payer: BLUE CROSS/BLUE SHIELD | Primary: Family

## 2016-09-15 DIAGNOSIS — R0789 Other chest pain: Secondary | ICD-10-CM

## 2016-09-15 LAB — URINALYSIS W/ RFLX MICROSCOPIC
Bilirubin: NEGATIVE
Glucose: NEGATIVE mg/dL
Ketone: NEGATIVE mg/dL
Leukocyte Esterase: NEGATIVE
Nitrites: POSITIVE — AB
Protein: NEGATIVE mg/dL
Specific gravity: 1.02 (ref 1.005–1.030)
Urobilinogen: 1 EU/dL (ref 0.1–1.0)
pH (UA): 6 (ref 4.5–8.0)

## 2016-09-15 LAB — URINE MICROSCOPIC

## 2016-09-15 LAB — HCG URINE, QL: HCG urine, QL: NEGATIVE

## 2016-09-15 NOTE — ED Notes (Signed)
Patient is awake, alert, and oriented, speech is clear and patient is able to ambulate (if applicable) and ready for discharge. Verbal and written discharge instructions provided and has the cognitive understanding of discharge instructions. Discharged home with family. All questions answered. Pt feeling well   No further return of cp  No rx sent

## 2016-09-15 NOTE — ED Triage Notes (Signed)
Pt sts she has chest pain on and off for a month  Pt sts not having pain right now  denuies cough  Or trauma  Placed room 3 ekg done  datascope

## 2016-09-15 NOTE — Other (Signed)
Chart accessed in regards to urine culture. Per Dr. Ananias PilgrimSilva'a s note, patient note having any dysuria or recent UTI. Discussed with Dr. Susy ManorHaralson, instructed to call patient in AM and see how feeling if having symptoms discuss with MD. Will pass on to oncoming day shift.

## 2016-09-15 NOTE — Other (Signed)
Chart accessed in regards to urine culture. Reviewed with Dr. Edward JollySilva, no intervention needed per MD.

## 2016-09-15 NOTE — ED Provider Notes (Signed)
HPI Comments: PT PRESENTS WITH ONGOING INTERMITTENT CP FOR A MONTH OR MORE. DESCRIBED AS BRIEF, SHARP STABLBING PAINS LASTING 5-10 SECONDS, NON RADIATING, NOT POSITIONAL OR EXERTIONAL. NO WORSE WITH MOVEMENT OR BREATHING. NOT ILL NO COUGH, FEVER, CHILLS, SOB, NO ABDOMINAL PAIN, N/V/D. NO CALF PAIN OR SWELLING, NO HX OR RISK FOR PE/DVT. DRINKS SOCIALLY, NO DRUGS OR TOBACCO, NO FH CAD. HAS IRREG PERIODS    Patient is a 26 y.o. female presenting with chest pain. The history is provided by the patient.   Chest Pain (Angina)    This is a new problem. The current episode started more than 1 week ago. The problem has been resolved. Duration of episode(s) is 5 seconds. The pain is associated with normal activity. The pain is present in the substernal region and left side. The pain is at a severity of 4/10. The pain is mild. The quality of the pain is described as sharp and stabbing. The pain does not radiate. Pertinent negatives include no abdominal pain, no back pain, no claudication, no cough, no diaphoresis, no dizziness, no exertional chest pressure, no fever, no headaches, no hemoptysis, no irregular heartbeat, no leg pain, no lower extremity edema, no malaise/fatigue, no nausea, no near-syncope, no numbness, no orthopnea, no palpitations, no PND, no shortness of breath, no sputum production, no vomiting and no weakness. She has tried nothing for the symptoms. Risk factors include no risk factors. Her past medical history does not include aneurysm, cancer, DM, DVT, HTN, PE or CHF. Pertinent negatives include no cardiac catheterization, no echocardiogram, no EPS study, no persantine thallium, no stress echo, no stress thallium, no exercise treadmill test, no cardiac stents, no angioplasty, no [place] stents, no pacemaker, no AICD and no CABG.       History reviewed. No pertinent past medical history.    Past Surgical History:   Procedure Laterality Date   ??? HX GYN      vaginal birth 2 years ago          History reviewed. No pertinent family history.    Social History     Social History   ??? Marital status: SINGLE     Spouse name: N/A   ??? Number of children: N/A   ??? Years of education: N/A     Occupational History   ??? Not on file.     Social History Main Topics   ??? Smoking status: Never Smoker   ??? Smokeless tobacco: Never Used   ??? Alcohol use Yes      Comment: socially   ??? Drug use: No   ??? Sexual activity: Not on file     Other Topics Concern   ??? Not on file     Social History Narrative         ALLERGIES: Review of patient's allergies indicates no known allergies.    Review of Systems   Constitutional: Negative.  Negative for diaphoresis, fever and malaise/fatigue.   HENT: Negative.    Eyes: Negative.    Respiratory: Negative.  Negative for cough, hemoptysis, sputum production and shortness of breath.    Cardiovascular: Positive for chest pain. Negative for palpitations, orthopnea, claudication, PND and near-syncope.   Gastrointestinal: Negative.  Negative for abdominal pain, nausea and vomiting.   Genitourinary: Negative.    Musculoskeletal: Negative.  Negative for back pain.   Skin: Negative.  Negative for rash.   Neurological: Negative.  Negative for dizziness, weakness, numbness and headaches.   Psychiatric/Behavioral: Negative.    All other  systems reviewed and are negative.      Vitals:    09/15/16 1332   BP: 105/76   Pulse: 92   Resp: 16   Temp: 98.1 ??F (36.7 ??C)   SpO2: 100%   Weight: 35.4 kg (78 lb)   Height: 5\' 2"  (1.575 m)            Physical Exam   Constitutional: She is oriented to person, place, and time. She appears well-developed and well-nourished. No distress.   HENT:   Head: Normocephalic and atraumatic.   Right Ear: External ear normal.   Left Ear: External ear normal.   Nose: Nose normal.   Mouth/Throat: Oropharynx is clear and moist. No oropharyngeal exudate.   Eyes: Conjunctivae and EOM are normal. Pupils are equal, round, and reactive to light.    Neck: Normal range of motion. Neck supple. No JVD present.   Cardiovascular: Normal rate, regular rhythm, normal heart sounds and intact distal pulses.  Exam reveals no gallop and no friction rub.    No murmur heard.  Pulmonary/Chest: Effort normal and breath sounds normal. No respiratory distress. She exhibits no tenderness.   Abdominal: Soft. Normal appearance and bowel sounds are normal. There is no tenderness.   SCAPHOID SOFT, NT, ND, NABS   Musculoskeletal: Normal range of motion. She exhibits no edema or tenderness.   Neurological: She is alert and oriented to person, place, and time. She has normal strength. No sensory deficit. GCS eye subscore is 4. GCS verbal subscore is 5. GCS motor subscore is 6.   Skin: Skin is warm and dry. No rash noted.   Psychiatric: She has a normal mood and affect. Her behavior is normal. Judgment and thought content normal.   Nursing note and vitals reviewed.       MDM      ED Course       Procedures    EKG: normal EKG, normal sinus rhythm, there are no previous tracings available for comparison, HR 71, NL AXIS, NO ISCHEMIA, NL EKG.    Labs Reviewed   URINALYSIS W/ RFLX MICROSCOPIC - Abnormal; Notable for the following:        Result Value    Appearance HAZY (*)     Blood TRACE (*)     Nitrites POSITIVE (*)     All other components within normal limits   URINE MICROSCOPIC - Abnormal; Notable for the following:     Bacteria 3+ (*)     All other components within normal limits   CULTURE, URINE   HCG URINE, QL     Xr Chest Pa Lat    Result Date: 09/15/2016  Chest radiographs   (two views)   performed on 09/15/2016 2:28 PM   for patient     female of 25 years CLINICAL INFORMATION:   Chest pain TECHNIQUE:  Frontal and lateral views of the chest were obtained. FINDINGS:  Comparison: Chest radiographs from 05/14/2016.  The lungs are clear.  No pleural abnormality is seen. The heart size is normal.  The mediastinum appears intact.      IMPRESSION:   No evidence of active chest disease. THIS DOCUMENT HAS BEEN ELECTRONICALLY SIGNED BY Colette RibasOBERT LIMANI MD    NO DYSURIA URGENCY OR FREQUENCY, NO RECENT UTI  NO PAIN IN ED, DOESN'T HAPPEN ALL THE TIME. VERY LOW RISK FOR CAD, PE/DVT CAN FOLLOW AS OUTPT  RTC IF WORSE PAIN, SOB, ETC    <EMERGENCY DEPARTMENT CASE SUMMARY>    Impression/Differential Diagnosis:  CHEST PAIN, PTX, CAD, ACS    Plan: EXAM, EKG, CXR    ED Course: UNREMARKABLE    Final Impression/Diagnosis:   1. Atypical chest pain          Patient condition at time of disposition: DIAGX        I have reviewed the following home medications:    Prior to Admission medications    Not on File         Andreas Blower, MD

## 2016-09-17 LAB — CULTURE, URINE: Culture result:: >100000 — AB

## 2016-11-01 ENCOUNTER — Inpatient Hospital Stay
Admit: 2016-11-01 | Discharge: 2016-11-01 | Disposition: A | Discharge status: Home / Self Care | Payer: BLUE CROSS/BLUE SHIELD | Attending: Emergency Medicine

## 2016-11-01 DIAGNOSIS — R002 Palpitations: Secondary | ICD-10-CM

## 2016-11-01 LAB — METABOLIC PANEL, BASIC
Anion gap: 9 mmol/L (ref 8–20)
BUN: 11 mg/dL (ref 7–18)
CO2: 26 mmol/L (ref 21–32)
Calcium: 8.9 mg/dL (ref 8.5–10.1)
Chloride: 104 mmol/L (ref 98–107)
Creatinine: 0.83 mg/dL (ref 0.55–1.02)
GFR est AA: >60 mL/min/{1.73_m2} (ref >60)
GFR est non-AA: >60 mL/min/{1.73_m2} (ref >60)
Glucose: 121 mg/dL — ABNORMAL HIGH (ref 74–106)
Potassium: 3.8 mmol/L (ref 3.5–5.1)
Sodium: 139 mmol/L (ref 136–145)

## 2016-11-01 LAB — CBC WITH AUTOMATED DIFF
ABS. BASOPHILS: 0 10*3/uL (ref 0.0–0.1)
ABS. EOSINOPHILS: 0 10*3/uL (ref 0.0–0.2)
ABS. LYMPHOCYTES: 2.1 10*3/uL (ref 1.0–5.5)
ABS. MONOCYTES: 0.5 10*3/uL (ref 0.1–1.0)
ABS. NEUTROPHILS: 4.4 10*3/uL (ref 2.0–8.1)
BASOPHILS: 0 % (ref 0.0–1.0)
EOSINOPHILS: 0 % (ref 0.0–2.0)
HCT: 35.1 % — ABNORMAL LOW (ref 37.0–47.0)
HGB: 12 g/dL (ref 12.0–16.0)
LYMPHOCYTES: 30 % (ref 20.5–51.1)
MCH: 33.6 PG — ABNORMAL HIGH (ref 27.0–31.0)
MCHC: 34.2 g/dL (ref 30.5–36.0)
MCV: 98.3 FL (ref 81.0–100.0)
MONOCYTES: 7 % (ref 1.7–10.0)
MPV: 10.2 FL (ref 10.2–12.7)
NEUTROPHILS: 63 % (ref 42.2–75.2)
PLATELET: 201 10*3/uL (ref 122–400)
RBC: 3.57 M/uL — ABNORMAL LOW (ref 4.20–5.40)
RDW: 11.9 % (ref 11.4–14.6)
WBC: 7.1 10*3/uL (ref 4.8–10.8)

## 2016-11-01 LAB — URINALYSIS W/ RFLX MICROSCOPIC
Bilirubin: NEGATIVE
Blood: NEGATIVE
Glucose: NEGATIVE mg/dL
Ketone: NEGATIVE mg/dL
Nitrites: NEGATIVE
Protein: NEGATIVE mg/dL
Specific gravity: 1.015 (ref 1.005–1.030)
Urobilinogen: 0.2 EU/dL (ref 0.1–1.0)
pH (UA): 6.5 (ref 4.5–8.0)

## 2016-11-01 LAB — URINE MICROSCOPIC

## 2016-11-01 LAB — HCG URINE, QL: HCG urine, QL: NEGATIVE

## 2016-11-01 NOTE — ED Triage Notes (Signed)
Pt woke with Palpitations and SOB.  States she was anxious.  Pt has had this episode before.  Upon arrival vitals WNL.

## 2016-11-01 NOTE — ED Provider Notes (Signed)
HPI Comments: 26 yo f h/o Anxiety reports awakening from sleep with sensation of her heart racing and sob.  Denies chest pain, cough, fever or chills.    Patient is a 26 y.o. female presenting with anxiety, palpitations, and shortness of breath. The history is provided by the patient.   Anxiety   This is a recurrent problem. The current episode started less than 1 hour ago. The problem has been resolved. Associated symptoms include shortness of breath. Pertinent negatives include no chest pain, no abdominal pain and no headaches. Nothing aggravates the symptoms. Nothing relieves the symptoms. She has tried nothing for the symptoms.   Palpitations    This is a recurrent problem. The current episode started less than 1 hour ago. The problem has been gradually improving. On average, each episode lasts 30 minutes. The problem is associated with stress. Associated symptoms include shortness of breath. Pertinent negatives include no diaphoresis, no fever, no malaise/fatigue, no numbness, no chest pain, no chest pressure, no claudication, no exertional chest pressure, no irregular heartbeat, no near-syncope, no orthopnea, no PND, no syncope, no abdominal pain, no nausea, no vomiting, no headaches, no back pain, no leg pain, no lower extremity edema, no dizziness, no weakness, no cough, no hemoptysis and no sputum production. Risk factors include no risk factors.   Shortness of Breath   Pertinent negatives include no fever, no headaches, no rhinorrhea, no sore throat, no neck pain, no cough, no sputum production, no hemoptysis, no PND, no orthopnea, no chest pain, no syncope, no vomiting, no abdominal pain, no rash, no leg pain and no claudication.        No past medical history on file.    Past Surgical History:   Procedure Laterality Date   ??? HX GYN      vaginal birth 2 years ago         No family history on file.    Social History     Social History   ??? Marital status: SINGLE     Spouse name: N/A    ??? Number of children: N/A   ??? Years of education: N/A     Occupational History   ??? Not on file.     Social History Main Topics   ??? Smoking status: Never Smoker   ??? Smokeless tobacco: Never Used   ??? Alcohol use Yes      Comment: socially   ??? Drug use: No   ??? Sexual activity: Not on file     Other Topics Concern   ??? Not on file     Social History Narrative         ALLERGIES: Review of patient's allergies indicates no known allergies.    Review of Systems   Constitutional: Negative for chills, diaphoresis, fever and malaise/fatigue.   HENT: Negative for rhinorrhea and sore throat.    Eyes: Negative for redness and visual disturbance.   Respiratory: Positive for shortness of breath. Negative for cough, hemoptysis and sputum production.    Cardiovascular: Positive for palpitations. Negative for chest pain, orthopnea, claudication, syncope, PND and near-syncope.   Gastrointestinal: Negative for abdominal pain, diarrhea, nausea and vomiting.   Genitourinary: Negative for difficulty urinating, dysuria and hematuria.   Musculoskeletal: Negative for back pain, myalgias, neck pain and neck stiffness.   Skin: Negative for pallor and rash.   Neurological: Negative for dizziness, syncope, weakness, light-headedness, numbness and headaches.       Vitals:    11/01/16 0212   BP:  101/75   Pulse: 72   Resp: 20   Temp: 98.2 ??F (36.8 ??C)   SpO2: 100%   Weight: 43.1 kg (95 lb)   Height: 5\' 3"  (1.6 m)            Physical Exam   Constitutional: She is oriented to person, place, and time. She appears well-developed and well-nourished. No distress.   HENT:   Head: Normocephalic and atraumatic.   Mouth/Throat: Oropharynx is clear and moist.   Eyes: EOM are normal. Pupils are equal, round, and reactive to light.   Neck: Neck supple. No JVD present.   Cardiovascular: Normal rate, regular rhythm and normal heart sounds.    Pulmonary/Chest: Breath sounds normal. No respiratory distress.    Abdominal: Soft. Bowel sounds are normal. She exhibits no distension. There is no tenderness.   Musculoskeletal: Normal range of motion. She exhibits no edema.   Neurological: She is alert and oriented to person, place, and time. No cranial nerve deficit.   Skin: Skin is warm and dry. She is not diaphoretic. No pallor.   Psychiatric: Her mood appears anxious.   Nursing note and vitals reviewed.       MDM      ED Course       Procedures    <EMERGENCY DEPARTMENT CASE SUMMARY>    Impression/Differential Diagnosis: Palpitations with SOB pulse oxymetry 100 %    Plan: Exam, observe on Cardiac Monitor, labs    ED Course: Labs reviewed:Unremarkable blood work  Urine:trace leu, nit/bld neg  WBC 10-20 Bact 1+  Pt has no urinary symptoms. Will await culture  3:37 AM no c/os, denies palpitations or chest pain, NSR @ 78 on telemetry, pulse ox 100 %    Final Impression/Diagnosis: Palpitations    Patient condition at time of disposition: Stable      I have reviewed the following home medications:    Prior to Admission medications    Not on File         Mikle Bosworthonald M Kyleen Villatoro, MD

## 2016-11-01 NOTE — ED Notes (Signed)
Patient is awake, alert, and oriented, speech is clear and patient is able to ambulate and ready for discharge. Verbal and written discharge instructions provided and has the cognitive understanding of discharge instructions. Discharged home with family. All questions answered.

## 2016-11-02 LAB — CULTURE, URINE
Culture result:: <10000
Culture: <10000

## 2018-03-13 ENCOUNTER — Inpatient Hospital Stay
Admit: 2018-03-13 | Discharge: 2018-03-13 | Disposition: A | Discharge status: Home / Self Care | Payer: MEDICAID | Attending: Emergency Medicine

## 2018-03-13 DIAGNOSIS — K5900 Constipation, unspecified: Secondary | ICD-10-CM

## 2018-03-13 MED ORDER — BISACODYL 5 MG TAB, DELAYED RELEASE
5 mg | Freq: Every day | ORAL | Status: DC | PRN
Start: 2018-03-13 — End: 2018-03-13
  Administered 2018-03-13: 19:00:00 via ORAL

## 2018-03-13 MED ORDER — SODIUM PHOSPHATES 19 GRAM-7 GRAM/118 ML ENEMA
19-7 gram/118 mL | RECTAL | Status: AC
Start: 2018-03-13 — End: 2018-03-13
  Administered 2018-03-13: 19:00:00 via RECTAL

## 2018-03-13 MED FILL — BISACODYL 5 MG TAB, DELAYED RELEASE: 5 mg | ORAL | Qty: 1

## 2018-03-13 MED FILL — ENEMA DISPOSABLE 19 GRAM-7 GRAM/118 ML: 19-7 gram/118 mL | RECTAL | Qty: 133

## 2018-03-13 NOTE — ED Notes (Signed)
Pt to ER c/o constipation. Pt does not remember last BM. Pt c/o rectal pain. States feels like stool will not come out. + hx constipation. Pt concerned because she has whole milk with cereal this morning. States she doesn't usually drink whole milk.

## 2018-03-13 NOTE — ED Notes (Signed)
Pt asking to go home. Pt cleared for DC by Dr. Dorise Bullion. VSS. No c/o pain - just rectal pressure as per pt. Patient is awake, alert, and oriented, speech is clear and patient is able to ambulate. Pt is ready for discharge. Verbal and written discharge instructions provided and pt has cognitive understanding of discharge instructions. Discharged home by self (with small child). All questions answered.

## 2018-03-13 NOTE — ED Provider Notes (Signed)
CC:  constipation    HPI:     Patient is a 27 y.o. female who presents with c/o of rectal pressure and feeling that she has to move her bowels but nothing coming.  Pt denies N/V, abd or rectal pain.  Pt states last BM was 3d ago and she has not taken anything to help induce BM            PCP:  None    PMHx:    Past Medical History:   Diagnosis Date   ??? Anxiety        PSHx:  Past Surgical History:   Procedure Laterality Date   ??? HX GYN      vaginal birth 2 years ago       Soc Hx /  Fam Hx:  Social History     Socioeconomic History   ??? Marital status: SINGLE     Spouse name: Not on file   ??? Number of children: Not on file   ??? Years of education: Not on file   ??? Highest education level: Not on file   Tobacco Use   ??? Smoking status: Never Smoker   ??? Smokeless tobacco: Never Used   Substance and Sexual Activity   ??? Alcohol use: Yes     Comment: socially   ??? Drug use: No     History reviewed. No pertinent family history.    ROS:  Const:  no fever,  no fatigue  Eyes;  no pain, no vision changes  Orophar:  no sore throat, no ear pain  Resp:  no cough,  no SOB  Card:  no chest pain,  no palpitations  Abd:  no vomiting,  no diarrhea  GU:  no dysuria, no hematuria  Musc:  no back pain, no joint pain  Neuro:  no headache,  no numbness  Skin:  no rash,  no petechia      PE:  Vs:   per RN notes  Visit Vitals  BP 115/74 (BP 1 Location: Left arm, BP Patient Position: Sitting)   Pulse (!) 105   Temp 97.5 ??F (36.4 ??C)   Resp 20   Ht 5\' 3"  (1.6 m)   Wt 36.3 kg (80 lb)   SpO2 100%   BMI 14.17 kg/m??     Head:  NCAT  Const:  WDWN, no distress  Eyes:  conj pink,  anicteric  Orophar:  no edema,  no erythema  Neck:  trachea midline, supple  Resp:  nl excursions,  normal BS,  no rales, no wheezes  Card:  nl rate,  regular rhythm,  no murmur,   Good cap refill in all extrem (< 3 sec)  Abd:  non-distended,  soft,  tenderness = none    Back:  supple,  not tender  GU:  Musc:  no leg edema, no cyanosis    Neuro:  awake and alert   CN:  no facial  asymmetry   Motor:  symmetric strength in upper and lower extrem       Skin:  warm, moist, no rash  Psych:  calm, normal affect      Lab results:  No results found for this or any previous visit (from the past 24 hour(s)).        Rad results:  No orders to display         ED Course: Pt given Dulcolax in the ED and fleets enema to take home and self administer if BM  not induced    Visit Vitals  BP 115/74 (BP 1 Location: Left arm, BP Patient Position: Sitting)   Pulse (!) 105   Temp 97.5 ??F (36.4 ??C)   Resp 20   Ht 5\' 3"  (1.6 m)   Wt 36.3 kg (80 lb)   SpO2 100%   BMI 14.17 kg/m??          Medications   sodium phosphate (FLEET'S) enema 1 Enema (1 Enema Rectal Given 03/13/18 1523)           Critical care time (if provided) excluding procedural time was  0  minutes.    FINAL DX:  Diagnoses that have been ruled out:   None   Diagnoses that are still under consideration:   None   Final diagnoses:   Constipation, unspecified constipation type       DISPO:    D/C HOME    Condition: stable    Rx:   I have reviewed the following home medications:  Prior to Admission medications    Not on File       Raechel Chute, MD

## 2018-03-13 NOTE — ED Provider Notes (Signed)
CC:  constipation    HPI:     Patient is a 27 y.o. female who presents with c/o of rectal pressure and feeling that she has to move her bowels but nothing coming.  Pt denies N/V, abd or rectal pain.  Pt states last BM was 3d ago and she has not taken anything to help induce BM            PCP:  None    PMHx:    Past Medical History:   Diagnosis Date   ??? Anxiety        PSHx:  Past Surgical History:   Procedure Laterality Date   ??? HX GYN      vaginal birth 2 years ago       Soc Hx /  Fam Hx:  Social History     Socioeconomic History   ??? Marital status: SINGLE     Spouse name: Not on file   ??? Number of children: Not on file   ??? Years of education: Not on file   ??? Highest education level: Not on file   Tobacco Use   ??? Smoking status: Never Smoker   ??? Smokeless tobacco: Never Used   Substance and Sexual Activity   ??? Alcohol use: Yes     Comment: socially   ??? Drug use: No     History reviewed. No pertinent family history.    ROS:  Const:  no fever,  no fatigue  Eyes;  no pain, no vision changes  Orophar:  no sore throat, no ear pain  Resp:  no cough,  no SOB  Card:  no chest pain,  no palpitations  Abd:  no vomiting,  no diarrhea  GU:  no dysuria, no hematuria  Musc:  no back pain, no joint pain  Neuro:  no headache,  no numbness  Skin:  no rash,  no petechia      PE:  Vs:   per RN notes  Visit Vitals  BP 115/74 (BP 1 Location: Left arm, BP Patient Position: Sitting)   Pulse (!) 105   Temp 97.5 ??F (36.4 ??C)   Resp 20   Ht 5\' 3"  (1.6 m)   Wt 36.3 kg (80 lb)   SpO2 100%   BMI 14.17 kg/m??     Head:  NCAT  Const:  WDWN, no distress  Eyes:  conj pink,  anicteric  Orophar:  no edema,  no erythema  Neck:  trachea midline, supple  Resp:  nl excursions,  normal BS,  no rales, no wheezes  Card:  nl rate,  regular rhythm,  no murmur,   Good cap refill in all extrem (< 3 sec)  Abd:  non-distended,  soft,  tenderness = none    Back:  supple,  not tender  GU:  Musc:  no leg edema, no cyanosis    Neuro:  awake and alert    CN:  no facial asymmetry   Motor:  symmetric strength in upper and lower extrem       Skin:  warm, moist, no rash  Psych:  calm, normal affect      Lab results:  No results found for this or any previous visit (from the past 24 hour(s)).        Rad results:  No orders to display         ED Course: Pt given Dulcolax in the ED and fleets enema to take home and self administer if BM  not induced    Visit Vitals  BP 115/74 (BP 1 Location: Left arm, BP Patient Position: Sitting)   Pulse (!) 105   Temp 97.5 ??F (36.4 ??C)   Resp 20   Ht 5\' 3"  (1.6 m)   Wt 36.3 kg (80 lb)   SpO2 100%   BMI 14.17 kg/m??          Medications   sodium phosphate (FLEET'S) enema 1 Enema (1 Enema Rectal Given 03/13/18 1523)           Critical care time (if provided) excluding procedural time was  0  minutes.    FINAL DX:  Diagnoses that have been ruled out:   None   Diagnoses that are still under consideration:   None   Final diagnoses:   Constipation, unspecified constipation type       DISPO:    D/C HOME    Condition: stable    Rx:   I have reviewed the following home medications:  Prior to Admission medications    Not on File       Raechel Chute, MD

## 2018-03-13 NOTE — ED Triage Notes (Addendum)
Pt to ER c/o constipation. Pt does not remember last BM. Pt c/o rectal pain. States feels like stool will not come out. + hx constipation. Pt concerned because she has whole milk with cereal this morning. States she doesn't usually drink whole milk.

## 2018-03-13 NOTE — ED Notes (Addendum)
Pt asking to go home. Pt cleared for DC by Dr. Dorise Bullion. VSS. No c/o pain - just rectal pressure as per pt. Patient is awake, alert, and oriented, speech is clear and patient is able to ambulate. Pt is ready for discharge. Verbal and written discharge instructions provided and pt has cognitive understanding of discharge instructions. Discharged home by self (with small child). All questions answered.

## 2018-05-28 ENCOUNTER — Inpatient Hospital Stay
Admit: 2018-05-28 | Discharge: 2018-05-28 | Disposition: A | Discharge status: Home / Self Care | Payer: MEDICAID | Attending: Geriatric Medicine

## 2018-05-28 ENCOUNTER — Emergency Department: Admit: 2018-05-28 | Payer: MEDICAID | Primary: Family

## 2018-05-28 DIAGNOSIS — R0781 Pleurodynia: Secondary | ICD-10-CM

## 2018-05-28 MED ORDER — IBUPROFEN 400 MG TAB
400 mg | ORAL | Status: AC
Start: 2018-05-28 — End: 2018-05-28
  Administered 2018-05-28: 20:00:00 via ORAL

## 2018-05-28 MED FILL — IBUPROFEN 400 MG TAB: 400 mg | ORAL | Qty: 1

## 2018-05-28 NOTE — ED Notes (Signed)
Pt sts she had a cough 3 weeks ago and now has sharp pains lower left ribs intermittently  sts she has a little blood when she blows her nose mixed with mucous

## 2018-05-28 NOTE — ED Notes (Signed)
Pt medicated

## 2018-05-28 NOTE — ED Provider Notes (Signed)
Patient presents with complaints  Of left sided chest pain x several days, underneath left breast. Pain was preceded by a  Cough x 3 weeks. Denies any injuries             Past Medical History:   Diagnosis Date   ??? Anxiety        Past Surgical History:   Procedure Laterality Date   ??? HX GYN      vaginal birth 2 years ago         History reviewed. No pertinent family history.    Social History     Socioeconomic History   ??? Marital status: SINGLE     Spouse name: Not on file   ??? Number of children: Not on file   ??? Years of education: Not on file   ??? Highest education level: Not on file   Occupational History   ??? Not on file   Social Needs   ??? Financial resource strain: Not on file   ??? Food insecurity:     Worry: Not on file     Inability: Not on file   ??? Transportation needs:     Medical: Not on file     Non-medical: Not on file   Tobacco Use   ??? Smoking status: Never Smoker   ??? Smokeless tobacco: Never Used   Substance and Sexual Activity   ??? Alcohol use: Yes     Comment: socially   ??? Drug use: No   ??? Sexual activity: Not on file   Lifestyle   ??? Physical activity:     Days per week: Not on file     Minutes per session: Not on file   ??? Stress: Not on file   Relationships   ??? Social connections:     Talks on phone: Not on file     Gets together: Not on file     Attends religious service: Not on file     Active member of club or organization: Not on file     Attends meetings of clubs or organizations: Not on file     Relationship status: Not on file   ??? Intimate partner violence:     Fear of current or ex partner: Not on file     Emotionally abused: Not on file     Physically abused: Not on file     Forced sexual activity: Not on file   Other Topics Concern   ??? Not on file   Social History Narrative   ??? Not on file         ALLERGIES: Patient has no known allergies.    Review of Systems   Respiratory: Positive for cough. Negative for shortness of breath.    Cardiovascular: Positive for chest pain.   Gastrointestinal:  Negative for abdominal pain.   All other systems reviewed and are negative.      Vitals:    05/28/18 1444 05/28/18 1448 05/28/18 1450   BP:   105/62   Pulse:   78   Resp:   18   Temp:   98.4 ??F (36.9 ??C)   SpO2:  99% 99%   Weight: 40.4 kg (89 lb)     Height: 5\' 3"  (1.6 m)              Physical Exam  Vitals signs and nursing note reviewed.   Constitutional:       Appearance: Normal appearance.      Comments:  Patient visibly underweight   HENT:      Head: Normocephalic and atraumatic.   Eyes:      Extraocular Movements: Extraocular movements intact.      Pupils: Pupils are equal, round, and reactive to light.   Cardiovascular:      Rate and Rhythm: Normal rate and regular rhythm.   Pulmonary:      Effort: Pulmonary effort is normal.      Breath sounds: Normal breath sounds.   Chest:      Chest wall: No tenderness.   Abdominal:      General: Abdomen is flat.      Palpations: Abdomen is soft.   Musculoskeletal:         General: No tenderness.   Neurological:      General: No focal deficit present.      Mental Status: She is alert and oriented to person, place, and time.            Xr Chest Pa Lat    Result Date: 05/28/2018  History: left chest pain Technique: PA and Lateral views of the chest 05/28/2018 4:17 PM Comparison: 09/15/2016. FINDINGS: No focal infiltrate or effusion is identified. The cardiac silhouette is unremarkable. The visualized osseous structures appear unremarkable.     IMPRESSION: No focal infiltrate or effusion       Recent Results (from the past 24 hour(s))   EKG, 12 LEAD, INITIAL    Collection Time: 05/28/18  3:39 PM   Result Value Ref Range    Ventricular Rate 78 BPM    Atrial Rate 78 BPM    P-R Interval 154 ms    QRS Duration 92 ms    Q-T Interval 366 ms    QTC Calculation (Bezet) 417 ms    Calculated P Axis 76 degrees    Calculated R Axis 25 degrees    Calculated T Axis 63 degrees    Diagnosis       Normal sinus rhythm  Normal ECG  No previous ECGs available         MDM        Procedures          <EMERGENCY DEPARTMENT CASE SUMMARY>    Impression/Differential Diagnosis: chest pain,  Musculoskeletal ?    Plan: fup with PCP    ED Course:   Orders Placed This Encounter   ??? XR CHEST PA LAT   ??? EKG, 12 LEAD, INITIAL   ??? ibuprofen (MOTRIN) tablet 400 mg       Final Impression/Diagnosis:   1. Musculoskeletal chest pain          Patient condition at time of disposition:    Fair      I have reviewed the following home medications:    Prior to Admission medications    Not on File         Lenis Dickinson, MD

## 2018-05-28 NOTE — ED Notes (Signed)
Patient is awake, alert, and oriented, and ready for discharge. Verbal and written discharge instructions provided and has the cognitive understanding of discharge instructions. Discharged home. All questions answered.

## 2018-05-28 NOTE — ED Triage Notes (Signed)
Pt sts she had a cough 3 weeks ago and now has sharp pains lower left ribs intermittently  sts she has a little blood when she blows her nose mixed with mucous

## 2018-05-28 NOTE — ED Provider Notes (Signed)
Patient presents with complaints  Of left sided chest pain x several days, underneath left breast. Pain was preceded by a  Cough x 3 weeks. Denies any injuries             Past Medical History:   Diagnosis Date   ??? Anxiety        Past Surgical History:   Procedure Laterality Date   ??? HX GYN      vaginal birth 2 years ago         History reviewed. No pertinent family history.    Social History     Socioeconomic History   ??? Marital status: SINGLE     Spouse name: Not on file   ??? Number of children: Not on file   ??? Years of education: Not on file   ??? Highest education level: Not on file   Occupational History   ??? Not on file   Social Needs   ??? Financial resource strain: Not on file   ??? Food insecurity:     Worry: Not on file     Inability: Not on file   ??? Transportation needs:     Medical: Not on file     Non-medical: Not on file   Tobacco Use   ??? Smoking status: Never Smoker   ??? Smokeless tobacco: Never Used   Substance and Sexual Activity   ??? Alcohol use: Yes     Comment: socially   ??? Drug use: No   ??? Sexual activity: Not on file   Lifestyle   ??? Physical activity:     Days per week: Not on file     Minutes per session: Not on file   ??? Stress: Not on file   Relationships   ??? Social connections:     Talks on phone: Not on file     Gets together: Not on file     Attends religious service: Not on file     Active member of club or organization: Not on file     Attends meetings of clubs or organizations: Not on file     Relationship status: Not on file   ??? Intimate partner violence:     Fear of current or ex partner: Not on file     Emotionally abused: Not on file     Physically abused: Not on file     Forced sexual activity: Not on file   Other Topics Concern   ??? Not on file   Social History Narrative   ??? Not on file         ALLERGIES: Patient has no known allergies.    Review of Systems   Respiratory: Positive for cough. Negative for shortness of breath.    Cardiovascular: Positive for chest pain.    Gastrointestinal: Negative for abdominal pain.   All other systems reviewed and are negative.      Vitals:    05/28/18 1444 05/28/18 1448 05/28/18 1450   BP:   105/62   Pulse:   78   Resp:   18   Temp:   98.4 ??F (36.9 ??C)   SpO2:  99% 99%   Weight: 40.4 kg (89 lb)     Height: 5\' 3"  (1.6 m)              Physical Exam  Vitals signs and nursing note reviewed.   Constitutional:       Appearance: Normal appearance.      Comments:  Patient visibly underweight   HENT:      Head: Normocephalic and atraumatic.   Eyes:      Extraocular Movements: Extraocular movements intact.      Pupils: Pupils are equal, round, and reactive to light.   Cardiovascular:      Rate and Rhythm: Normal rate and regular rhythm.   Pulmonary:      Effort: Pulmonary effort is normal.      Breath sounds: Normal breath sounds.   Chest:      Chest wall: No tenderness.   Abdominal:      General: Abdomen is flat.      Palpations: Abdomen is soft.   Musculoskeletal:         General: No tenderness.   Neurological:      General: No focal deficit present.      Mental Status: She is alert and oriented to person, place, and time.            Xr Chest Pa Lat    Result Date: 05/28/2018  History: left chest pain Technique: PA and Lateral views of the chest 05/28/2018 4:17 PM Comparison: 09/15/2016. FINDINGS: No focal infiltrate or effusion is identified. The cardiac silhouette is unremarkable. The visualized osseous structures appear unremarkable.     IMPRESSION: No focal infiltrate or effusion       Recent Results (from the past 24 hour(s))   EKG, 12 LEAD, INITIAL    Collection Time: 05/28/18  3:39 PM   Result Value Ref Range    Ventricular Rate 78 BPM    Atrial Rate 78 BPM    P-R Interval 154 ms    QRS Duration 92 ms    Q-T Interval 366 ms    QTC Calculation (Bezet) 417 ms    Calculated P Axis 76 degrees    Calculated R Axis 25 degrees    Calculated T Axis 63 degrees    Diagnosis       Normal sinus rhythm  Normal ECG  No previous ECGs available         MDM        Procedures          <EMERGENCY DEPARTMENT CASE SUMMARY>    Impression/Differential Diagnosis: chest pain,  Musculoskeletal ?    Plan: fup with PCP    ED Course:   Orders Placed This Encounter   ??? XR CHEST PA LAT   ??? EKG, 12 LEAD, INITIAL   ??? ibuprofen (MOTRIN) tablet 400 mg       Final Impression/Diagnosis:   1. Musculoskeletal chest pain          Patient condition at time of disposition:    Fair      I have reviewed the following home medications:    Prior to Admission medications    Not on File         Lenis Dickinson, MD

## 2018-05-28 NOTE — ED Notes (Signed)
Patient is awake, alert, and oriented, and ready for discharge. Verbal and written discharge instructions provided and has the cognitive understanding of discharge instructions. Discharged home. All questions answered.

## 2018-06-01 LAB — EKG 12-LEAD
Atrial Rate: 78 {beats}/min
Diagnosis: NORMAL
P Axis: 76 degrees
P-R Interval: 154 ms
Q-T Interval: 366 ms
QRS Duration: 92 ms
QTc Calculation (Bazett): 417 ms
R Axis: 25 degrees
T Axis: 63 degrees
Ventricular Rate: 78 {beats}/min

## 2018-06-01 LAB — EKG, 12 LEAD, INITIAL
Atrial Rate: 78 {beats}/min
Calculated P Axis: 76 degrees
Calculated R Axis: 25 degrees
Calculated T Axis: 63 degrees
Diagnosis: NORMAL
P-R Interval: 154 ms
Q-T Interval: 366 ms
QRS Duration: 92 ms
QTC Calculation (Bezet): 417 ms
Ventricular Rate: 78 {beats}/min

## 2019-01-27 ENCOUNTER — Emergency Department: Admit: 2019-01-27 | Payer: MEDICAID | Primary: Family

## 2019-01-27 ENCOUNTER — Inpatient Hospital Stay
Admit: 2019-01-27 | Discharge: 2019-01-27 | Disposition: A | Discharge status: Home / Self Care | Payer: MEDICAID | Attending: Emergency Medicine

## 2019-01-27 DIAGNOSIS — R1013 Epigastric pain: Secondary | ICD-10-CM

## 2019-01-27 LAB — URINALYSIS W/ RFLX MICROSCOPIC
Bilirubin UA, confirm: POSITIVE
Bilirubin Urine: POSITIVE
Glucose, Ur: NEGATIVE mg/dL
Glucose: NEGATIVE mg/dL
Ketone: 80 mg/dL — AB
Ketones, Urine: 80 mg/dL — AB
Leukocyte Esterase, Urine: NEGATIVE
Leukocyte Esterase: NEGATIVE
Nitrite, Urine: NEGATIVE
Nitrites: NEGATIVE
Protein, UA: 30 mg/dL — AB
Protein: 30 mg/dL — AB
Specific Gravity, UA: >1.03 NA — ABNORMAL HIGH (ref 1.005–1.030)
Specific gravity: >1.03 — ABNORMAL HIGH (ref 1.005–1.030)
Urobilinogen, UA, POCT: 0.2 EU/dL (ref 0.1–1.0)
Urobilinogen: 0.2 EU/dL (ref 0.1–1.0)
pH (UA): 6 (ref 5.0–8.0)
pH, UA: 6 (ref 5.0–8.0)

## 2019-01-27 LAB — URINE MICROSCOPIC

## 2019-01-27 LAB — PROTIME-INR
INR: 1.1 (ref 0.8–1.2)
Protime: 11.5 s — ABNORMAL HIGH (ref 9.6–11.0)

## 2019-01-27 LAB — CBC WITH AUTO DIFFERENTIAL
Basophils %: 0 % (ref 0.0–3.0)
Basophils Absolute: 0 10*3/uL (ref 0.0–0.4)
Eosinophils %: 0 % (ref 0.0–7.0)
Eosinophils Absolute: 0 10*3/uL (ref 0.0–1.0)
Granulocyte Absolute Count: 0 10*3/uL (ref 0.0–0.17)
Hematocrit: 36.5 % (ref 36.0–47.0)
Hemoglobin: 12.3 g/dL (ref 12.0–16.0)
Immature Granulocytes: 0 % (ref 0–0.5)
Lymphocytes %: 21 % (ref 18.0–40.0)
Lymphocytes Absolute: 1.6 10*3/uL (ref 0.9–4.2)
MCH: 32 PG (ref 27.0–35.0)
MCHC: 33.7 g/dL (ref 30.7–37.3)
MCV: 95.1 FL — ABNORMAL HIGH (ref 81.0–94.0)
MPV: 10.9 FL (ref 9.2–11.8)
Monocytes %: 4 % (ref 2.0–12.0)
Monocytes Absolute: 0.3 10*3/uL (ref 0.1–1.7)
NRBC Absolute: 0 10*3/uL (ref 0.0–0.01)
Neutrophils %: 74 % — ABNORMAL HIGH (ref 48.0–72.0)
Neutrophils Absolute: 5.7 10*3/uL (ref 2.3–7.6)
Nucleated RBCs: 0 PER 100 WBC
Platelets: 247 10*3/uL (ref 130–400)
RBC: 3.84 M/uL — ABNORMAL LOW (ref 4.20–5.40)
RDW: 11.7 % (ref 11.5–14.0)
WBC: 7.7 10*3/uL (ref 4.8–10.6)

## 2019-01-27 LAB — COMPREHENSIVE METABOLIC PANEL
ALT: 14 U/L (ref 12–78)
AST: 17 U/L (ref 15–37)
Albumin/Globulin Ratio: 1.1 (ref 1.0–1.5)
Albumin: 4.1 g/dL (ref 3.4–5.0)
Alkaline Phosphatase: 47 U/L (ref 46–116)
Anion Gap: 6 mmol/L — ABNORMAL LOW (ref 8–20)
BUN: 9 mg/dL (ref 7–18)
CO2: 28 mmol/L (ref 21–32)
Calcium: 8.8 mg/dL (ref 8.5–10.1)
Chloride: 104 mmol/L (ref 98–107)
Creatinine: 0.8 mg/dL (ref 0.55–1.02)
EGFR IF NonAfrican American: >60 mL/min/{1.73_m2} (ref >60)
GFR African American: >60 mL/min/{1.73_m2} (ref >60)
Globulin: 3.6 g/dL (ref 2.5–5.0)
Glucose: 92 mg/dL (ref 74–106)
Potassium: 3.3 mmol/L — ABNORMAL LOW (ref 3.5–5.1)
Sodium: 138 mmol/L (ref 136–145)
Total Bilirubin: 1.3 mg/dL — ABNORMAL HIGH (ref 0.2–1.0)
Total Protein: 7.7 g/dL (ref 6.4–8.2)

## 2019-01-27 LAB — TYPE AND SCREEN
ABO/Rh: A POS
Antibody Screen: NEGATIVE

## 2019-01-27 LAB — APTT: aPTT: 26.3 s (ref 24.3–32.2)

## 2019-01-27 LAB — BETA HCG, QT
HCG,INTACT, THCGA1: 2148 m[IU]/mL — ABNORMAL HIGH (ref 1–3)
HCG,INTACT: 2148 m[IU]/mL — ABNORMAL HIGH (ref 1–3)

## 2019-01-27 LAB — CBC WITH AUTOMATED DIFF
ABS. BASOPHILS: 0 10*3/uL (ref 0.0–0.4)
ABS. EOSINOPHILS: 0 10*3/uL (ref 0.0–1.0)
ABS. IMM. GRANS.: 0 10*3/uL (ref 0.0–0.17)
ABS. LYMPHOCYTES: 1.6 10*3/uL (ref 0.9–4.2)
ABS. MONOCYTES: 0.3 10*3/uL (ref 0.1–1.7)
ABS. NEUTROPHILS: 5.7 10*3/uL (ref 2.3–7.6)
ABSOLUTE NRBC: 0 10*3/uL (ref 0.0–0.01)
BASOPHILS: 0 % (ref 0.0–3.0)
EOSINOPHILS: 0 % (ref 0.0–7.0)
HCT: 36.5 % (ref 36.0–47.0)
HGB: 12.3 g/dL (ref 12.0–16.0)
IMMATURE GRANULOCYTES: 0 % (ref 0–0.5)
LYMPHOCYTES: 21 % (ref 18.0–40.0)
MCH: 32 PG (ref 27.0–35.0)
MCHC: 33.7 g/dL (ref 30.7–37.3)
MCV: 95.1 FL — ABNORMAL HIGH (ref 81.0–94.0)
MONOCYTES: 4 % (ref 2.0–12.0)
MPV: 10.9 FL (ref 9.2–11.8)
NEUTROPHILS: 74 % — ABNORMAL HIGH (ref 48.0–72.0)
NRBC: 0 PER 100 WBC
PLATELET: 247 10*3/uL (ref 130–400)
RBC: 3.84 M/uL — ABNORMAL LOW (ref 4.20–5.40)
RDW: 11.7 % (ref 11.5–14.0)
WBC: 7.7 10*3/uL (ref 4.8–10.6)

## 2019-01-27 LAB — METABOLIC PANEL, COMPREHENSIVE
A-G Ratio: 1.1 (ref 1.0–1.5)
ALT (SGPT): 14 U/L (ref 12–78)
AST (SGOT): 17 U/L (ref 15–37)
Albumin: 4.1 g/dL (ref 3.4–5.0)
Alk. phosphatase: 47 U/L (ref 46–116)
Anion gap: 6 mmol/L — ABNORMAL LOW (ref 8–20)
BUN: 9 mg/dL (ref 7–18)
Bilirubin, total: 1.3 mg/dL — ABNORMAL HIGH (ref 0.2–1.0)
CO2: 28 mmol/L (ref 21–32)
Calcium: 8.8 mg/dL (ref 8.5–10.1)
Chloride: 104 mmol/L (ref 98–107)
Creatinine: 0.8 mg/dL (ref 0.55–1.02)
GFR est AA: >60 mL/min/{1.73_m2} (ref >60)
GFR est non-AA: >60 mL/min/{1.73_m2} (ref >60)
Globulin: 3.6 g/dL (ref 2.5–5.0)
Glucose: 92 mg/dL (ref 74–106)
Potassium: 3.3 mmol/L — ABNORMAL LOW (ref 3.5–5.1)
Protein, total: 7.7 g/dL (ref 6.4–8.2)
Sodium: 138 mmol/L (ref 136–145)

## 2019-01-27 LAB — PROTHROMBIN TIME + INR
INR: 1.1 (ref 0.8–1.2)
Prothrombin time: 11.5 s — ABNORMAL HIGH (ref 9.6–11.0)

## 2019-01-27 LAB — TYPE & SCREEN
ABO/Rh(D): A POS
Antibody screen: NEGATIVE

## 2019-01-27 LAB — PTT: aPTT: 26.3 s (ref 24.3–32.2)

## 2019-01-27 MED ORDER — ALUM-MAG HYDROXIDE-SIMETH 200 MG-200 MG-20 MG/5 ML ORAL SUSP
200-200-20 mg/5 mL | ORAL | Status: AC
Start: 2019-01-27 — End: 2019-01-27
  Administered 2019-01-27: 12:00:00 via ORAL

## 2019-01-27 MED FILL — MAG-AL PLUS 200 MG-200 MG-20 MG/5 ML ORAL SUSPENSION: 200-200-20 mg/5 mL | ORAL | Qty: 30

## 2019-01-27 NOTE — ED Notes (Signed)
 Patient is awake, alert, and oriented, speech is clear and patient is able to ambulateand ready for discharge. Verbal and written discharge instructions provided and has the cognitive understanding of discharge instructions. Discharged home. All questions answered.

## 2019-01-27 NOTE — ED Notes (Signed)
Bedside shift change report given to Delma Officer, RM, (oncoming nurse) by Blenda Mounts, RN, (offgoing nurse). Report included the following information SBAR.

## 2019-01-27 NOTE — ED Notes (Signed)
Case signed out to me by Dr. Ellin Mayhew at 8:00 AM   Hx: 28 y.o. female s/p TOP post op day # 5 , wo presents to ED w/c/o positional upper abdominal pain. No fever or urinary sx. Minimal vaginal bleeding. No vaginal pain and lower abdominal and pelvic discomfort   Plan: check labs and ultrasound ; anticipate discharge if normal        Recent Results (from the past 24 hour(s))   BETA HCG, QT    Collection Time: 01/27/19  6:30 AM   Result Value Ref Range    HCG,INTACT 2,148 (H) 1 - 3 MIU/mL   METABOLIC PANEL, COMPREHENSIVE    Collection Time: 01/27/19  6:30 AM   Result Value Ref Range    Sodium 138 136 - 145 mmol/L    Potassium 3.3 (L) 3.5 - 5.1 mmol/L    Chloride 104 98 - 107 mmol/L    CO2 28 21 - 32 mmol/L    Anion gap 6 (L) 8 - 20 mmol/L    Glucose 92 74 - 106 mg/dL    BUN 9 7 - 18 mg/dL    Creatinine 0.80 0.55 - 1.02 mg/dL    GFR est AA >60 >60 ml/min/1.61m    GFR est non-AA >60 >60 ml/min/1.738m   Calcium 8.8 8.5 - 10.1 mg/dL    Bilirubin, total 1.3 (H) 0.2 - 1.0 mg/dL    ALT (SGPT) 14 12 - 78 U/L    AST (SGOT) 17 15 - 37 U/L    Alk. phosphatase 47 46 - 116 U/L    Protein, total 7.7 6.4 - 8.2 g/dL    Albumin 4.1 3.4 - 5.0 g/dL    Globulin 3.6 2.5 - 5.0 g/dL    A-G Ratio 1.1 1.0 - 1.5     CBC WITH AUTOMATED DIFF    Collection Time: 01/27/19  6:30 AM   Result Value Ref Range    WBC 7.7 4.8 - 10.6 K/uL    RBC 3.84 (L) 4.20 - 5.40 M/uL    HGB 12.3 12.0 - 16.0 g/dL    HCT 36.5 36.0 - 47.0 %    MCV 95.1 (H) 81.0 - 94.0 FL    MCH 32.0 27.0 - 35.0 PG    MCHC 33.7 30.7 - 37.3 g/dL    RDW 11.7 11.5 - 14.0 %    PLATELET 247 130 - 400 K/uL    MPV 10.9 9.2 - 11.8 FL    NRBC 0.0 0.0 PER 100 WBC    ABSOLUTE NRBC 0.00 0.0 - 0.01 K/uL    NEUTROPHILS 74 (H) 48.0 - 72.0 %    LYMPHOCYTES 21 18.0 - 40.0 %    MONOCYTES 4 2.0 - 12.0 %    EOSINOPHILS 0 0.0 - 7.0 %    BASOPHILS 0 0.0 - 3.0 %    IMMATURE GRANULOCYTES 0 0 - 0.5 %    ABS. NEUTROPHILS 5.7 2.3 - 7.6 K/UL    ABS. LYMPHOCYTES 1.6 0.9 - 4.2 K/UL    ABS. MONOCYTES 0.3 0.1 - 1.7  K/UL    ABS. EOSINOPHILS 0.0 0.0 - 1.0 K/UL    ABS. BASOPHILS 0.0 0.0 - 0.4 K/UL    ABS. IMM. GRANS. 0.0 0.0 - 0.17 K/UL    DF AUTOMATED     PROTHROMBIN TIME + INR    Collection Time: 01/27/19  6:30 AM   Result Value Ref Range    Prothrombin time 11.5 (H) 9.6 - 11.0 sec  INR 1.1 0.8 - 1.2     PTT    Collection Time: 01/27/19  6:30 AM   Result Value Ref Range    aPTT 26.3 24.3 - 32.2 SEC   TYPE & SCREEN    Collection Time: 01/27/19  6:30 AM   Result Value Ref Range    Crossmatch Expiration 01/30/2019     ABO/Rh(D) A POSITIVE     Antibody screen NEG    URINALYSIS W/ RFLX MICROSCOPIC    Collection Time: 01/27/19  8:38 AM   Result Value Ref Range    Color YELLOW YEL      Appearance HAZY (A) CLEAR      Specific gravity >1.030 (H) 1.005 - 1.030    pH (UA) 6.0 5.0 - 8.0      Protein 30 (A) NEG mg/dL    Glucose Negative NEG mg/dL    Ketone 80 (A) NEG mg/dL    Bilirubin SMALL (A) NEG      Blood LARGE (A) NEG      Urobilinogen 0.2 0.1 - 1.0 EU/dL    Nitrites Negative NEG      Leukocyte Esterase Negative NEG       [place] Pelv Non Obs    Result Date: 01/27/2019  HISTORY:  Status post abortion on 01/22/2019 with bleeding and abdominal pain. LMP: Patient is unsure. COMPARISON: None. Transpelvic sonography revealed the uterus to be anteverted and grossly normal. There are some prominent vessels in the anterior wall. The uterus measures 7.7 x 5.0 x 3.5  cms. There is no evidence of a fibroid. There is no evidence of a significant amount of fluid in the cul-de-sac. The endometrial echo complex measures  3 mm. (Premenopausal upper limits of normal: 10 mm.; Postmenopausal upper limits of normal: 5 mm). There is no evidence of retained products of conception. The right ovary is normal. There is no evidence of solid or cystic lesions. The left ovary is normal. There is no evidence of solid or cystic lesions. The right ovary measures  3.1 x 2.3 x 2.6 cms. The left ovary measures 4.8 x 2.0 x 3.0 cms. Arterial and venous flow is noted in each  ovary. Color Doppler and duplex Doppler sonography was obtained due to the pain. The right and left resistive index 0.8 and 0.8, respectively.     IMPRESSION: Normal study.       <EMERGENCY DEPARTMENT CASE SUMMARY>    IED Course: ED work up discussed with in full detail and she will f/u w/ ob/gyn as scheduled     Final Impression/Diagnosis:     ICD-10-CM ICD-9-CM    1. Abdominal pain, epigastric  R10.13 789.06          Patient condition at time of disposition: stable      I have reviewed the following home medications:    Prior to Admission medications    Not on File         Donnamarie Rossetti, MD

## 2019-01-27 NOTE — ED Notes (Signed)
PT attempted to give a urine sample 2 times with no success.

## 2019-01-27 NOTE — ED Provider Notes (Signed)
28 yo f  G 4 P 1 AB 3, S/P TOP 5 days ago presents c/o epigastric abdominal pain, worse on movement since yesterday afternoon initially relieved with Motrin, recurring this AM.  Pt reports a small amount of persistent Vaginal bleeding since the procedure.  Denies fever, chills, dysuria, nausea or vomiting.  Pt states she feels the pain on sitting up from a supine position.    The history is provided by the patient.   Abdominal Pain    This is a new problem. The current episode started yesterday. The problem occurs rarely. The problem has not changed since onset.The pain is located in the epigastric region. The quality of the pain is aching. The pain is moderate. Pertinent negatives include no anorexia, no fever, no belching, no diarrhea, no flatus, no hematochezia, no melena, no nausea, no vomiting, no constipation, no dysuria, no frequency, no hematuria, no headaches, no arthralgias, no myalgias, no trauma, no chest pain and no back pain. Nothing worsens the pain. The pain is relieved by nothing. Past workup includes ultrasound.        Past Medical History:   Diagnosis Date   ??? Abortion Sept 12th, 2020   ??? Anxiety        Past Surgical History:   Procedure Laterality Date   ??? HX GYN      vaginal birth 2 years ago         History reviewed. No pertinent family history.    Social History     Socioeconomic History   ??? Marital status: SINGLE     Spouse name: Not on file   ??? Number of children: Not on file   ??? Years of education: Not on file   ??? Highest education level: Not on file   Occupational History   ??? Not on file   Social Needs   ??? Financial resource strain: Not on file   ??? Food insecurity     Worry: Not on file     Inability: Not on file   ??? Transportation needs     Medical: Not on file     Non-medical: Not on file   Tobacco Use   ??? Smoking status: Never Smoker   ??? Smokeless tobacco: Never Used   Substance and Sexual Activity   ??? Alcohol use: Yes     Comment: socially   ??? Drug use: No   ??? Sexual activity: Not on  file   Lifestyle   ??? Physical activity     Days per week: Not on file     Minutes per session: Not on file   ??? Stress: Not on file   Relationships   ??? Social Wellsite geologistconnections     Talks on phone: Not on file     Gets together: Not on file     Attends religious service: Not on file     Active member of club or organization: Not on file     Attends meetings of clubs or organizations: Not on file     Relationship status: Not on file   ??? Intimate partner violence     Fear of current or ex partner: Not on file     Emotionally abused: Not on file     Physically abused: Not on file     Forced sexual activity: Not on file   Other Topics Concern   ??? Not on file   Social History Narrative   ??? Not on file  ALLERGIES: Patient has no known allergies.    Review of Systems   Constitutional: Negative for chills and fever.   HENT: Negative for rhinorrhea and sore throat.    Eyes: Negative for redness and visual disturbance.   Respiratory: Negative for cough and shortness of breath.    Cardiovascular: Negative for chest pain and palpitations.   Gastrointestinal: Positive for abdominal pain. Negative for anorexia, constipation, diarrhea, flatus, hematochezia, melena, nausea and vomiting.   Genitourinary: Positive for vaginal bleeding. Negative for difficulty urinating, dysuria, frequency and hematuria.   Musculoskeletal: Negative for arthralgias, back pain, myalgias, neck pain and neck stiffness.   Skin: Negative for pallor and rash.   Neurological: Negative for syncope, light-headedness and headaches.       Vitals:    01/27/19 0603 01/27/19 0605   BP: 104/76    Pulse: (!) 101    Resp: 16    Temp: 97.7 ??F (36.5 ??C)    SpO2: 96%    Weight:  38.1 kg (84 lb)   Height:  5\' 3"  (1.6 m)            Physical Exam  Vitals signs and nursing note reviewed.   Constitutional:       General: She is not in acute distress.     Appearance: She is well-developed. She is not diaphoretic.   HENT:      Head: Normocephalic and atraumatic.   Eyes:       Pupils: Pupils are equal, round, and reactive to light.   Neck:      Musculoskeletal: Neck supple.      Vascular: No JVD.   Cardiovascular:      Rate and Rhythm: Normal rate and regular rhythm.      Heart sounds: Normal heart sounds.   Pulmonary:      Effort: No respiratory distress.      Breath sounds: Normal breath sounds.   Abdominal:      General: Bowel sounds are normal. There is no distension.      Palpations: Abdomen is soft.      Tenderness: There is abdominal tenderness in the epigastric area. There is no right CVA tenderness, left CVA tenderness, guarding or rebound. Negative signs include Murphy's sign and McBurney's sign.      Hernia: No hernia is present.   Musculoskeletal: Normal range of motion.   Skin:     General: Skin is warm and dry.      Coloration: Skin is not pale.   Neurological:      Mental Status: She is alert and oriented to person, place, and time.      Cranial Nerves: No cranial nerve deficit.   Psychiatric:         Mood and Affect: Mood normal.         Behavior: Behavior normal.          MDM       Procedures    <EMERGENCY DEPARTMENT CASE SUMMARY>    Impression/Differential Diagnosis: Epigastric Abdominal Pain, Vaginal bleeding, S/P TOP    Plan: Exam, IV Fluids, labs, urine, HCG, Pelvic US    ED Course: Labs reviewed:nl WBC, no significant shift, unremarkable chem    7:37 AM wet read of [place] by tech is nl, pt reports taking Motrin PTA and pain is improving    8:06 AM final [place] report and urine pending, signed out to Dr. Candiss Norse to check prior to discharge    Final Impression/Diagnosis: Epigastric Abdominal Pain, S/P TOP  Patient condition at time of disposition: Stable      I have reviewed the following home medications:    Prior to Admission medications    Not on File         Rhunette Croft, MD

## 2019-01-27 NOTE — ED Notes (Signed)
Pt to ER CAOx3 C/O Upper Mid ABD Pain that started yesterday afternoon at 1500. After taking a motrin, the pain went away and started again this morning at 0400. Pt is statis post Abortion 5 days ago.  Pt has slight bleeding. Denies any other symptoms.

## 2019-01-27 NOTE — ED Notes (Signed)
Patient is awake, alert, and oriented, speech is clear and patient is able to ambulate and ready for discharge. Verbal and written discharge instructions provided and has the cognitive understanding of discharge instructions. Discharged home. All questions answered.

## 2019-01-27 NOTE — ED Notes (Signed)
PT attempted to give a urine sample 2 times with no success.

## 2019-01-27 NOTE — ED Notes (Signed)
Case signed out to me by Dr. Ellin Mayhew at 8:00 AM   Hx: 28 y.o. female s/p TOP post op day # 5 , wo presents to ED w/c/o positional upper abdominal pain. No fever or urinary sx. Minimal vaginal bleeding. No vaginal pain and lower abdominal and pelvic discomfort   Plan: check labs and ultrasound ; anticipate discharge if normal        Recent Results (from the past 24 hour(s))   BETA HCG, QT    Collection Time: 01/27/19  6:30 AM   Result Value Ref Range    HCG,INTACT 2,148 (H) 1 - 3 MIU/mL   METABOLIC PANEL, COMPREHENSIVE    Collection Time: 01/27/19  6:30 AM   Result Value Ref Range    Sodium 138 136 - 145 mmol/L    Potassium 3.3 (L) 3.5 - 5.1 mmol/L    Chloride 104 98 - 107 mmol/L    CO2 28 21 - 32 mmol/L    Anion gap 6 (L) 8 - 20 mmol/L    Glucose 92 74 - 106 mg/dL    BUN 9 7 - 18 mg/dL    Creatinine 0.80 0.55 - 1.02 mg/dL    GFR est AA >60 >60 ml/min/1.87m    GFR est non-AA >60 >60 ml/min/1.758m   Calcium 8.8 8.5 - 10.1 mg/dL    Bilirubin, total 1.3 (H) 0.2 - 1.0 mg/dL    ALT (SGPT) 14 12 - 78 U/L    AST (SGOT) 17 15 - 37 U/L    Alk. phosphatase 47 46 - 116 U/L    Protein, total 7.7 6.4 - 8.2 g/dL    Albumin 4.1 3.4 - 5.0 g/dL    Globulin 3.6 2.5 - 5.0 g/dL    A-G Ratio 1.1 1.0 - 1.5     CBC WITH AUTOMATED DIFF    Collection Time: 01/27/19  6:30 AM   Result Value Ref Range    WBC 7.7 4.8 - 10.6 K/uL    RBC 3.84 (L) 4.20 - 5.40 M/uL    HGB 12.3 12.0 - 16.0 g/dL    HCT 36.5 36.0 - 47.0 %    MCV 95.1 (H) 81.0 - 94.0 FL    MCH 32.0 27.0 - 35.0 PG    MCHC 33.7 30.7 - 37.3 g/dL    RDW 11.7 11.5 - 14.0 %    PLATELET 247 130 - 400 K/uL    MPV 10.9 9.2 - 11.8 FL    NRBC 0.0 0.0 PER 100 WBC    ABSOLUTE NRBC 0.00 0.0 - 0.01 K/uL    NEUTROPHILS 74 (H) 48.0 - 72.0 %    LYMPHOCYTES 21 18.0 - 40.0 %    MONOCYTES 4 2.0 - 12.0 %    EOSINOPHILS 0 0.0 - 7.0 %    BASOPHILS 0 0.0 - 3.0 %    IMMATURE GRANULOCYTES 0 0 - 0.5 %    ABS. NEUTROPHILS 5.7 2.3 - 7.6 K/UL    ABS. LYMPHOCYTES 1.6 0.9 - 4.2 K/UL     ABS. MONOCYTES 0.3 0.1 - 1.7 K/UL    ABS. EOSINOPHILS 0.0 0.0 - 1.0 K/UL    ABS. BASOPHILS 0.0 0.0 - 0.4 K/UL    ABS. IMM. GRANS. 0.0 0.0 - 0.17 K/UL    DF AUTOMATED     PROTHROMBIN TIME + INR    Collection Time: 01/27/19  6:30 AM   Result Value Ref Range    Prothrombin time 11.5 (H) 9.6 - 11.0 sec  INR 1.1 0.8 - 1.2     PTT    Collection Time: 01/27/19  6:30 AM   Result Value Ref Range    aPTT 26.3 24.3 - 32.2 SEC   TYPE & SCREEN    Collection Time: 01/27/19  6:30 AM   Result Value Ref Range    Crossmatch Expiration 01/30/2019     ABO/Rh(D) A POSITIVE     Antibody screen NEG    URINALYSIS W/ RFLX MICROSCOPIC    Collection Time: 01/27/19  8:38 AM   Result Value Ref Range    Color YELLOW YEL      Appearance HAZY (A) CLEAR      Specific gravity >1.030 (H) 1.005 - 1.030    pH (UA) 6.0 5.0 - 8.0      Protein 30 (A) NEG mg/dL    Glucose Negative NEG mg/dL    Ketone 80 (A) NEG mg/dL    Bilirubin SMALL (A) NEG      Blood LARGE (A) NEG      Urobilinogen 0.2 0.1 - 1.0 EU/dL    Nitrites Negative NEG      Leukocyte Esterase Negative NEG       [place] Pelv Non Obs    Result Date: 01/27/2019  HISTORY:  Status post abortion on 01/22/2019 with bleeding and abdominal pain. LMP: Patient is unsure. COMPARISON: None. Transpelvic sonography revealed the uterus to be anteverted and grossly normal. There are some prominent vessels in the anterior wall. The uterus measures 7.7 x 5.0 x 3.5  cms. There is no evidence of a fibroid. There is no evidence of a significant amount of fluid in the cul-de-sac. The endometrial echo complex measures  3 mm. (Premenopausal upper limits of normal: 10 mm.; Postmenopausal upper limits of normal: 5 mm). There is no evidence of retained products of conception. The right ovary is normal. There is no evidence of solid or cystic lesions. The left ovary is normal. There is no evidence of solid or cystic lesions. The right ovary measures  3.1 x 2.3 x  2.6 cms. The left ovary measures 4.8 x 2.0 x 3.0 cms. Arterial and venous flow is noted in each ovary. Color Doppler and duplex Doppler sonography was obtained due to the pain. The right and left resistive index 0.8 and 0.8, respectively.     IMPRESSION: Normal study.       <EMERGENCY DEPARTMENT CASE SUMMARY>    IED Course: ED work up discussed with in full detail and she will f/u w/ ob/gyn as scheduled     Final Impression/Diagnosis:     ICD-10-CM ICD-9-CM    1. Abdominal pain, epigastric  R10.13 789.06          Patient condition at time of disposition: stable      I have reviewed the following home medications:    Prior to Admission medications    Not on File         Donnamarie Rossetti, MD

## 2019-01-27 NOTE — ED Triage Notes (Signed)
Pt to ER CAOx3 C/O Upper Mid ABD Pain that started yesterday afternoon at 1500. After taking a motrin, the pain went away and started again this morning at 0400. Pt is statis post Abortion 5 days ago.  Pt has slight bleeding. Denies any other symptoms.

## 2019-01-27 NOTE — ED Notes (Signed)
Bedside shift change report given to Elise Lama, RM, (oncoming nurse) by Guido Marzan, RN, (offgoing nurse). Report included the following information SBAR.

## 2019-01-27 NOTE — ED Provider Notes (Addendum)
28 yo f  G 4 P 1 AB 3, S/P TOP 5 days ago presents c/o epigastric abdominal pain, worse on movement since yesterday afternoon initially relieved with Motrin, recurring this AM.  Pt reports a small amount of persistent Vaginal bleeding since the procedure.  Denies fever, chills, dysuria, nausea or vomiting.  Pt states she feels the pain on sitting up from a supine position.    The history is provided by the patient.   Abdominal Pain    This is a new problem. The current episode started yesterday. The problem occurs rarely. The problem has not changed since onset.The pain is located in the epigastric region. The quality of the pain is aching. The pain is moderate. Pertinent negatives include no anorexia, no fever, no belching, no diarrhea, no flatus, no hematochezia, no melena, no nausea, no vomiting, no constipation, no dysuria, no frequency, no hematuria, no headaches, no arthralgias, no myalgias, no trauma, no chest pain and no back pain. Nothing worsens the pain. The pain is relieved by nothing. Past workup includes ultrasound.        Past Medical History:   Diagnosis Date   ??? Abortion Sept 12th, 2020   ??? Anxiety        Past Surgical History:   Procedure Laterality Date   ??? HX GYN      vaginal birth 2 years ago         History reviewed. No pertinent family history.    Social History     Socioeconomic History   ??? Marital status: SINGLE     Spouse name: Not on file   ??? Number of children: Not on file   ??? Years of education: Not on file   ??? Highest education level: Not on file   Occupational History   ??? Not on file   Social Needs   ??? Financial resource strain: Not on file   ??? Food insecurity     Worry: Not on file     Inability: Not on file   ??? Transportation needs     Medical: Not on file     Non-medical: Not on file   Tobacco Use   ??? Smoking status: Never Smoker   ??? Smokeless tobacco: Never Used   Substance and Sexual Activity   ??? Alcohol use: Yes     Comment: socially   ??? Drug use: No    ??? Sexual activity: Not on file   Lifestyle   ??? Physical activity     Days per week: Not on file     Minutes per session: Not on file   ??? Stress: Not on file   Relationships   ??? Social Product manager on phone: Not on file     Gets together: Not on file     Attends religious service: Not on file     Active member of club or organization: Not on file     Attends meetings of clubs or organizations: Not on file     Relationship status: Not on file   ??? Intimate partner violence     Fear of current or ex partner: Not on file     Emotionally abused: Not on file     Physically abused: Not on file     Forced sexual activity: Not on file   Other Topics Concern   ??? Not on file   Social History Narrative   ??? Not on file  ALLERGIES: Patient has no known allergies.    Review of Systems   Constitutional: Negative for chills and fever.   HENT: Negative for rhinorrhea and sore throat.    Eyes: Negative for redness and visual disturbance.   Respiratory: Negative for cough and shortness of breath.    Cardiovascular: Negative for chest pain and palpitations.   Gastrointestinal: Positive for abdominal pain. Negative for anorexia, constipation, diarrhea, flatus, hematochezia, melena, nausea and vomiting.   Genitourinary: Positive for vaginal bleeding. Negative for difficulty urinating, dysuria, frequency and hematuria.   Musculoskeletal: Negative for arthralgias, back pain, myalgias, neck pain and neck stiffness.   Skin: Negative for pallor and rash.   Neurological: Negative for syncope, light-headedness and headaches.       Vitals:    01/27/19 0603 01/27/19 0605   BP: 104/76    Pulse: (!) 101    Resp: 16    Temp: 97.7 ??F (36.5 ??C)    SpO2: 96%    Weight:  38.1 kg (84 lb)   Height:  5\' 3"  (1.6 m)            Physical Exam  Vitals signs and nursing note reviewed.   Constitutional:       General: She is not in acute distress.     Appearance: She is well-developed. She is not diaphoretic.   HENT:       Head: Normocephalic and atraumatic.   Eyes:      Pupils: Pupils are equal, round, and reactive to light.   Neck:      Musculoskeletal: Neck supple.      Vascular: No JVD.   Cardiovascular:      Rate and Rhythm: Normal rate and regular rhythm.      Heart sounds: Normal heart sounds.   Pulmonary:      Effort: No respiratory distress.      Breath sounds: Normal breath sounds.   Abdominal:      General: Bowel sounds are normal. There is no distension.      Palpations: Abdomen is soft.      Tenderness: There is abdominal tenderness in the epigastric area. There is no right CVA tenderness, left CVA tenderness, guarding or rebound. Negative signs include Murphy's sign and McBurney's sign.      Hernia: No hernia is present.   Musculoskeletal: Normal range of motion.   Skin:     General: Skin is warm and dry.      Coloration: Skin is not pale.   Neurological:      Mental Status: She is alert and oriented to person, place, and time.      Cranial Nerves: No cranial nerve deficit.   Psychiatric:         Mood and Affect: Mood normal.         Behavior: Behavior normal.          MDM       Procedures    <EMERGENCY DEPARTMENT CASE SUMMARY>    Impression/Differential Diagnosis: Epigastric Abdominal Pain, Vaginal bleeding, S/P TOP    Plan: Exam, IV Fluids, labs, urine, HCG, Pelvic US    ED Course: Labs reviewed:nl WBC, no significant shift, unremarkable chem    7:37 AM wet read of US by tech is nl, pt reports taking Motrin PTA and pain is improving    8:06 AM final US report and urine pending, signed out to Dr. Thedore MinsSingh to check prior to discharge    Final Impression/Diagnosis: Epigastric Abdominal Pain, S/P TOP  Patient condition at time of disposition: Stable      I have reviewed the following home medications:    Prior to Admission medications    Not on File         Rhunette Croft, MD

## 2019-09-26 ENCOUNTER — Inpatient Hospital Stay
Admit: 2019-09-26 | Discharge: 2019-09-26 | Disposition: A | Discharge status: Home / Self Care | Payer: MEDICAID | Attending: Emergency Medicine

## 2019-09-26 DIAGNOSIS — L0231 Cutaneous abscess of buttock: Secondary | ICD-10-CM

## 2019-09-26 MED ORDER — CLINDAMYCIN 300 MG CAP
300 mg | ORAL_CAPSULE | Freq: Four times a day (QID) | ORAL | 0 refills | Status: AC
Start: 2019-09-26 — End: 2019-10-06

## 2019-09-26 MED ORDER — HYDROCODONE-ACETAMINOPHEN 5 MG-325 MG TAB
5-325 mg | ORAL_TABLET | Freq: Four times a day (QID) | ORAL | 0 refills | Status: AC | PRN
Start: 2019-09-26 — End: 2019-09-29

## 2019-09-26 NOTE — ED Notes (Signed)
I&D to abscess by Dr. Elsie Stain. Packing placed by MD. Bulky dsg placed as ordered.

## 2019-09-26 NOTE — ED Notes (Signed)
Pt cleared for Dc by Dr. Elsie Stain. VSS. Patient is awake, alert, and oriented, speech is clear and patient is able to ambulate. Pt is ready for discharge. Verbal and written discharge instructions provided and pt has cognitive understanding of discharge instructions. Discharged home by self. All questions answered.

## 2019-09-26 NOTE — ED Notes (Signed)
Pt to Er with abscess to left upper thigh/buttock since last Mon. Pt went to Urgent Care and started Bactrim on 5/14. Pt has not been taking Bactrim as directed.

## 2019-09-26 NOTE — ED Provider Notes (Signed)
ED Provider Notes by Mikle Bosworth, MD at 09/26/19 1825                Author: Mikle Bosworth, MD  Service: Emergency Medicine  Author Type: Physician       Filed: 09/26/19 1833  Date of Service: 09/26/19 1825  Status: Signed          Editor: Mikle Bosworth, MD (Physician)            Procedure Orders        1. I&D Abcess Simple [[id number]] ordered by Mikle Bosworth, MD                              29 yo f h/o Anxiety, seen in Urgent care with an abscess on her L Buttock/Upper thigh, Rxed with Bactrim which  she started on 09/22/2019 but stopped on Saturday and Sunday since she was taking Plan B, restarted this AM c/o pain and swelling of the area.  Denies fever, chills, redness and drainage, started as a pimple after boyfriend was Dxed with the same condition.      The history is provided by the patient.    Abscess   This is a new problem. The  current episode started more than 2 days ago. The problem  occurs constantly. The problem has been gradually worsening.  Pertinent negatives include no chest pain, no abdominal  pain, no headaches and no shortness of breath. Nothing aggravates the symptoms. Nothing relieves the symptoms. Treatments tried: Antibx. The treatment provided no relief.             Past Medical History:        Diagnosis  Date         ?  Abortion  Sept 12th, 2020         ?  Anxiety               Past Surgical History:         Procedure  Laterality  Date          ?  HX GYN              vaginal birth 2 years ago             History reviewed. No pertinent family history.        Social History          Socioeconomic History         ?  Marital status:  SINGLE              Spouse name:  Not on file         ?  Number of children:  Not on file     ?  Years of education:  Not on file     ?  Highest education level:  Not on file       Occupational History        ?  Not on file       Social Needs         ?  Financial resource strain:  Not on file        ?  Food insecurity              Worry:  Not on  file         Inability:  Not on file        ?  Transportation needs              Medical:  Not on file         Non-medical:  Not on file       Tobacco Use         ?  Smoking status:  Never Smoker     ?  Smokeless tobacco:  Never Used       Substance and Sexual Activity         ?  Alcohol use:  Yes             Comment: socially         ?  Drug use:  No     ?  Sexual activity:  Not on file       Lifestyle        ?  Physical activity              Days per week:  Not on file         Minutes per session:  Not on file         ?  Stress:  Not on file       Relationships        ?  Social Engineer, manufacturing systems on phone:  Not on file         Gets together:  Not on file         Attends religious service:  Not on file         Active member of club or organization:  Not on file         Attends meetings of clubs or organizations:  Not on file         Relationship status:  Not on file        ?  Intimate partner violence              Fear of current or ex partner:  Not on file         Emotionally abused:  Not on file         Physically abused:  Not on file         Forced sexual activity:  Not on file        Other Topics  Concern        ?  Not on file       Social History Narrative        ?  Not on file              ALLERGIES: Patient has no known allergies.      Review of Systems    Constitutional: Negative for chills and fever.    Respiratory: Negative for cough and shortness of breath.     Cardiovascular: Negative for chest pain and palpitations.    Gastrointestinal: Negative for abdominal pain, nausea and vomiting.    Skin: Positive for wound.    Neurological: Negative for syncope, light-headedness and headaches.    All other systems reviewed and are negative.           Vitals:             09/26/19 1615  09/26/19 1620  09/26/19 1741  09/26/19 1822           BP:  103/68    114/62  120/68     Pulse:  100    88  79  Resp:  16    20  19      Temp:  98.4 ??F (36.9 ??C)      98.4 ??F (36.9 ??C)     SpO2:  98%    99%  98%      Weight:    37.6 kg (83 lb)               Height:    5\' 3"  (1.6 m)                    Physical Exam   Vitals signs and nursing note reviewed.   Constitutional:        General: She is in acute distress (discomfort) .      Appearance: She is well-developed. She is not diaphoretic.   Cardiovascular:       Rate and Rhythm: Regular rhythm.      Heart sounds: Normal heart sounds.    Pulmonary:       Effort: No respiratory distress.      Breath sounds: Normal breath sounds.   Abdominal :      General: Bowel sounds are normal. There is no distension.      Palpations: Abdomen is soft.      Tenderness: There is no abdominal tenderness.     Musculoskeletal:        Legs:       Skin:      General: Skin is warm and dry.   Neurological :       Mental Status: She is alert and oriented to person, place, and time.    Psychiatric:         Mood and Affect: Mood normal.         Behavior: Behavior normal.              MDM          I&D Abcess Simple      Date/Time: 09/26/2019 5:31 PM   Performed by:  , MD   Authorized by:  09/28/2019, MD       Consent:      Consent obtained:  Verbal     Consent given by:  Patient     Risks discussed:  Bleeding, incomplete drainage and pain     Alternatives discussed:  Delayed treatment   Location:      Type:  Abscess     Size:  2 x 2 cm     Location:  Lower extremity     Lower extremity location:  Buttock     Buttock location:  L buttock   Pre-procedure details:      Skin preparation:  Betadine   Anesthesia (see MAR for exact dosages):      Anesthesia method:  Local infiltration     Local anesthetic:  Lidocaine 2% w/o epi (9 ml)   Procedure type:      Complexity:  Complex   Procedure details:     Needle aspiration: no       Incision types:  Cruciate     Incision depth:  Subcutaneous     Scalpel blade:  11     Wound management:  Probed and deloculated, irrigated with saline and extensive cleaning     Drainage:  Purulent     Drainage amount:  Moderate     Wound treatment:  Wound left  open     Packing materials:  1/2 in gauze     Amount 1/2":  8"   Post-procedure details:      Patient tolerance of procedure:  Tolerated well, no immediate complications            <EMERGENCY DEPARTMENT CASE SUMMARY>      Impression/Differential Diagnosis: Abscess L Buttock      Plan: Exam, I & D, culture, analgesic, extend antibx coverage as pt discontinued Rx x 2 days      ED Course: as above      Final Impression/Diagnosis: I & D Abscess L Buttock      Patient condition at time of disposition: Stable         I have reviewed the following home medications:        Prior to Admission medications             Medication  Sig  Start Date  End Date  Taking?  Authorizing Provider            trimethoprim-sulfamethoxazole (Bactrim DS) 160-800 mg per tablet  Take 1 Tab by mouth two (2) times a day.      Yes  Other, Phys, MD     clindamycin (CLEOCIN) 300 mg capsule  Take 1 Cap by mouth four (4) times daily for 10 days.  09/26/19  10/06/19  Yes  Rhunette Croft, MD            HYDROcodone-acetaminophen [facility]) 5-325 mg per tablet  Take 1 Tab by mouth every six (6) hours as needed for Pain for up to 3 days. Max Daily Amount: 4 Tabs.  09/26/19  09/29/19  Yes  Bronte Kropf, Edison Simon, MD              Rhunette Croft, MD

## 2019-09-26 NOTE — ED Notes (Signed)
09/29/2019  8:27 AM    Chart accessed in regards to wound culture. Treatment appropriate with clindamycin and bactrim, no further intervention needed.

## 2019-09-26 NOTE — ED Notes (Signed)
Pt cleared for Dc by Dr. Roshe. VSS. Patient is awake, alert, and oriented, speech is clear and patient is able to ambulate. Pt is ready for discharge. Verbal and written discharge instructions provided and pt has cognitive understanding of discharge instructions. Discharged home by self. All questions answered.

## 2019-09-26 NOTE — ED Provider Notes (Signed)
29 yo f h/o Anxiety, seen in Urgent care with an abscess on her L Buttock/Upper thigh, Rxed with Bactrim which she started on 09/22/2019 but stopped on Saturday and Sunday since she was taking Plan B, restarted this AM c/o pain and swelling of the area.  Denies fever, chills, redness and drainage, started as a pimple after boyfriend was Dxed with the same condition.    The history is provided by the patient.   Abscess  This is a new problem. The current episode started more than 2 days ago. The problem occurs constantly. The problem has been gradually worsening. Pertinent negatives include no chest pain, no abdominal pain, no headaches and no shortness of breath. Nothing aggravates the symptoms. Nothing relieves the symptoms. Treatments tried: Antibx. The treatment provided no relief.        Past Medical History:   Diagnosis Date   ??? Abortion Sept 12th, 2020   ??? Anxiety        Past Surgical History:   Procedure Laterality Date   ??? HX GYN      vaginal birth 2 years ago         History reviewed. No pertinent family history.    Social History     Socioeconomic History   ??? Marital status: SINGLE     Spouse name: Not on file   ??? Number of children: Not on file   ??? Years of education: Not on file   ??? Highest education level: Not on file   Occupational History   ??? Not on file   Social Needs   ??? Financial resource strain: Not on file   ??? Food insecurity     Worry: Not on file     Inability: Not on file   ??? Transportation needs     Medical: Not on file     Non-medical: Not on file   Tobacco Use   ??? Smoking status: Never Smoker   ??? Smokeless tobacco: Never Used   Substance and Sexual Activity   ??? Alcohol use: Yes     Comment: socially   ??? Drug use: No   ??? Sexual activity: Not on file   Lifestyle   ??? Physical activity     Days per week: Not on file     Minutes per session: Not on file   ??? Stress: Not on file   Relationships   ??? Social Wellsite geologist on phone: Not on file     Gets together: Not on file     Attends  religious service: Not on file     Active member of club or organization: Not on file     Attends meetings of clubs or organizations: Not on file     Relationship status: Not on file   ??? Intimate partner violence     Fear of current or ex partner: Not on file     Emotionally abused: Not on file     Physically abused: Not on file     Forced sexual activity: Not on file   Other Topics Concern   ??? Not on file   Social History Narrative   ??? Not on file         ALLERGIES: Patient has no known allergies.    Review of Systems   Constitutional: Negative for chills and fever.   Respiratory: Negative for cough and shortness of breath.    Cardiovascular: Negative for chest pain and palpitations.  Gastrointestinal: Negative for abdominal pain, nausea and vomiting.   Skin: Positive for wound.   Neurological: Negative for syncope, light-headedness and headaches.   All other systems reviewed and are negative.      Vitals:    09/26/19 1615 09/26/19 1620 09/26/19 1741 09/26/19 1822   BP: 103/68  114/62 120/68   Pulse: 100  88 79   Resp: 16  20 19    Temp: 98.4 ??F (36.9 ??C)   98.4 ??F (36.9 ??C)   SpO2: 98%  99% 98%   Weight:  37.6 kg (83 lb)     Height:  5\' 3"  (1.6 m)              Physical Exam  Vitals signs and nursing note reviewed.   Constitutional:       General: She is in acute distress (discomfort).      Appearance: She is well-developed. She is not diaphoretic.   Cardiovascular:      Rate and Rhythm: Regular rhythm.      Heart sounds: Normal heart sounds.   Pulmonary:      Effort: No respiratory distress.      Breath sounds: Normal breath sounds.   Abdominal:      General: Bowel sounds are normal. There is no distension.      Palpations: Abdomen is soft.      Tenderness: There is no abdominal tenderness.   Musculoskeletal:        Legs:    Skin:     General: Skin is warm and dry.   Neurological:      Mental Status: She is alert and oriented to person, place, and time.   Psychiatric:         Mood and Affect: Mood normal.          Behavior: Behavior normal.          MDM       I&D Abcess Simple    Date/Time: 09/26/2019 5:31 PM  Performed by: , MD  Authorized by: 09/28/2019, MD     Consent:     Consent obtained:  Verbal    Consent given by:  Patient    Risks discussed:  Bleeding, incomplete drainage and pain    Alternatives discussed:  Delayed treatment  Location:     Type:  Abscess    Size:  2 x 2 cm    Location:  Lower extremity    Lower extremity location:  Buttock    Buttock location:  L buttock  Pre-procedure details:     Skin preparation:  Betadine  Anesthesia (see MAR for exact dosages):     Anesthesia method:  Local infiltration    Local anesthetic:  Lidocaine 2% w/o epi (9 ml)  Procedure type:     Complexity:  Complex  Procedure details:     Needle aspiration: no      Incision types:  Cruciate    Incision depth:  Subcutaneous    Scalpel blade:  11    Wound management:  Probed and deloculated, irrigated with saline and extensive cleaning    Drainage:  Purulent    Drainage amount:  Moderate    Wound treatment:  Wound left open    Packing materials:  1/2 in gauze    Amount 1/2":  8"  Post-procedure details:     Patient tolerance of procedure:  Tolerated well, no immediate complications        <EMERGENCY DEPARTMENT CASE SUMMARY>  Impression/Differential Diagnosis: Abscess L Buttock    Plan: Exam, I & D, culture, analgesic, extend antibx coverage as pt discontinued Rx x 2 days    ED Course: as above    Final Impression/Diagnosis: I & D Abscess L Buttock    Patient condition at time of disposition: Stable      I have reviewed the following home medications:    Prior to Admission medications    Medication Sig Start Date End Date Taking? Authorizing Provider   trimethoprim-sulfamethoxazole (Bactrim DS) 160-800 mg per tablet Take 1 Tab by mouth two (2) times a day.   Yes Other, Phys, MD   clindamycin (CLEOCIN) 300 mg capsule Take 1 Cap by mouth four (4) times daily for 10 days. 09/26/19 10/06/19 Yes Rhunette Croft, MD    HYDROcodone-acetaminophen [facility]) 5-325 mg per tablet Take 1 Tab by mouth every six (6) hours as needed for Pain for up to 3 days. Max Daily Amount: 4 Tabs. 09/26/19 09/29/19 Yes Ethelyne Erich, Edison Simon, MD         Rhunette Croft, MD

## 2019-09-26 NOTE — ED Notes (Signed)
I&D to abscess by Dr. Roshe. Packing placed by MD. Bulky dsg placed as ordered.

## 2019-09-26 NOTE — ED Triage Notes (Signed)
Pt to Er with abscess to left upper thigh/buttock since last Mon. Pt went to Urgent Care and started Bactrim on 5/14. Pt has not been taking Bactrim as directed.

## 2019-09-26 NOTE — ED Notes (Signed)
09/29/2019  8:27 AM    Chart accessed in regards to wound culture. Treatment appropriate with clindamycin and bactrim, no further intervention needed.

## 2019-09-28 ENCOUNTER — Inpatient Hospital Stay
Admit: 2019-09-28 | Discharge: 2019-09-28 | Disposition: A | Discharge status: Home / Self Care | Payer: MEDICAID | Attending: Emergency Medicine

## 2019-09-28 DIAGNOSIS — Z5189 Encounter for other specified aftercare: Secondary | ICD-10-CM

## 2019-09-28 NOTE — ED Notes (Signed)
Patient is awake, alert, and oriented, speech is clear and patient is able to ambulate (if applicable) and ready for discharge. Verbal and written discharge instructions provided and has the cognitive understanding of discharge instructions. Discharged home. All questions answered.

## 2019-09-28 NOTE — ED Notes (Signed)
Patient returns to the ED for wound check to have pacing removed - from left buttocks.

## 2019-09-28 NOTE — ED Provider Notes (Signed)
ED Provider Notes by Earlyne Iba, MD at 09/28/19 1343                Author: Earlyne Iba, MD  Service: EMERGENCY  Author Type: Physician       Filed: 09/28/19 1413  Date of Service: 09/28/19 1343  Status: Signed          Editor: Earlyne Iba, MD (Physician)               29 year old who presents with a complaint of wound check, abscess packing removal.  Had an incision and drainage  of an abscess 2 days ago with packing insertion.  Has been placed on antibiotics also.  She believes that if she got this infection from her boyfriend.  Denies any fevers or chills.  No further drainage noted.  Pain is much improved after incision and  drainage.      The history is provided by the patient. No language interpreter  was used.    Wound Check   This  is a new problem. The current episode started less than 1 hour ago.  The problem has been resolved. Pertinent negatives include no chest pain, no abdominal pain, no headaches and no shortness of breath. Nothing aggravates the symptoms.  Nothing relieves the symptoms. She has tried nothing for the symptoms. The treatment provided no relief.             Past Medical History:        Diagnosis  Date         ?  Abortion  Sept 12th, 2020         ?  Anxiety               Past Surgical History:         Procedure  Laterality  Date          ?  HX GYN              vaginal birth 2 years ago             No family history on file.        Social History          Socioeconomic History         ?  Marital status:  SINGLE              Spouse name:  Not on file         ?  Number of children:  Not on file     ?  Years of education:  Not on file     ?  Highest education level:  Not on file       Occupational History        ?  Not on file       Tobacco Use         ?  Smoking status:  Never Smoker     ?  Smokeless tobacco:  Never Used       Substance and Sexual Activity         ?  Alcohol use:  Yes             Comment: socially         ?  Drug use:  No     ?  Sexual activity:  Not on  file        Other Topics  Concern        ?  Not on file  Social History Narrative        ?  Not on file          Social Determinants of Health          Financial Resource Strain:         ?  Difficulty of Paying Living Expenses:        Food Insecurity:         ?  Worried About Charity fundraiser in the Last Year:      ?  Arboriculturist in the Last Year:        Transportation Needs:         ?  Film/video editor (Medical):      ?  Lack of Transportation (Non-Medical):        Physical Activity:         ?  Days of Exercise per Week:      ?  Minutes of Exercise per Session:        Stress:         ?  Feeling of Stress :        Social Connections:         ?  Frequency of Communication with Friends and Family:      ?  Frequency of Social Gatherings with Friends and Family:      ?  Attends Religious Services:      ?  Active Member of Clubs or Organizations:      ?  Attends Archivist Meetings:      ?  Marital Status:        Intimate Partner Violence:         ?  Fear of Current or Ex-Partner:      ?  Emotionally Abused:      ?  Physically Abused:         ?  Sexually Abused:               ALLERGIES: Patient has no known allergies.      Review of Systems    Constitutional: Negative.  Negative for appetite change, chills and diaphoresis.    HENT: Negative.  Negative for dental problem, ear pain, facial swelling, hearing loss, postnasal drip, rhinorrhea and sinus pain.     Eyes: Negative.  Negative for pain, redness and itching.    Respiratory: Negative.  Negative for choking, chest tightness and shortness of breath.     Cardiovascular: Negative.  Negative for chest pain and palpitations.    Gastrointestinal: Negative.  Negative for abdominal pain, constipation, diarrhea and nausea.    Endocrine: Negative.  Negative for heat intolerance, polydipsia and polyphagia.    Genitourinary: Negative.  Negative for dysuria, hematuria and urgency.    Musculoskeletal: Negative.  Negative for back pain, joint swelling,  myalgias and neck pain.    Skin: Positive for wound.    Allergic/Immunologic: Negative.     Neurological: Negative.  Negative for headaches.    Hematological: Negative.     Psychiatric/Behavioral: Negative.  Negative for confusion and self-injury. The patient is not nervous/anxious and is not hyperactive.     All other systems reviewed and are negative.           Vitals:           09/28/19 1301  09/28/19 1302         BP:    99/65     Pulse:    94  Resp:    16     Temp:    98.2 ??F (36.8 ??C)     SpO2:    98%     Weight:  38.1 kg (84 lb)           Height:  5\' 3"  (1.6 m)                  Physical Exam   Vitals and nursing note reviewed.   Constitutional:        Appearance: She is normal weight.   HENT :       Head: Normocephalic and atraumatic.      Nose: Nose normal.      Mouth/Throat:      Mouth: Mucous membranes are moist.      Pharynx: Oropharynx is clear.   Eyes:       Extraocular Movements: Extraocular movements intact.      Conjunctiva/sclera: Conjunctivae normal.      Pupils: Pupils are equal, round, and reactive to light.   Cardiovascular :       Rate and Rhythm: Normal rate and regular rhythm.   Pulmonary :       Effort: Pulmonary effort is normal.      Breath sounds: Normal breath sounds.   Abdominal :      General: Abdomen is flat. Bowel sounds are normal.    Skin:      General: Skin is warm.      Capillary Refill: Capillary refill takes less than 2 seconds.               Comments: Abscess cavity noted with packing, packing removed, no further purulence noted.    Neurological :       Mental Status: She is alert.   Psychiatric :         Mood and Affect: Mood normal.         Behavior: Behavior normal.              MDM   Number of Diagnoses or Management Options   Encounter for removal of abscess packing: established and improving   Visit for wound check: established and improving   Diagnosis management comments: 29 year old who presents with a complaint of wound check, packing removal.  Packing removed  successfully, given home instructions on how to treat the open wound.   Discharged home with instructions to complete the oral antibiotics.          Amount and/or Complexity of Data Reviewed   Clinical lab tests: ordered and reviewed    Tests in the medicine section of CPT??: ordered and reviewed      Risk of Complications, Morbidity, and/or Mortality   Presenting problems: minimal  Diagnostic procedures: minimal  Management options: minimal     Patient Progress   Patient progress: stable             Procedures      <EMERGENCY DEPARTMENT CASE SUMMARY>      Impression/Differential Diagnosis: Abscess, cellulitis, wound care      Plan: Packing removal, reassess      ED Course: As above      Final Impression/Diagnosis:       1.  Visit for wound check         2.  Encounter for removal of abscess packing               Patient condition at time of disposition: Stable  I have reviewed the following home medications:        Prior to Admission medications             Medication  Sig  Start Date  End Date  Taking?  Authorizing Provider            trimethoprim-sulfamethoxazole (Bactrim DS) 160-800 mg per tablet  Take 1 Tab by mouth two (2) times a day.      Yes  Other, Phys, MD     clindamycin (CLEOCIN) 300 mg capsule  Take 1 Cap by mouth four (4) times daily for 10 days.  09/26/19  10/06/19  Yes  Mikle Bosworth, MD            HYDROcodone-acetaminophen [facility]) 5-325 mg per tablet  Take 1 Tab by mouth every six (6) hours as needed for Pain for up to 3 days. Max Daily Amount: 4 Tabs.  09/26/19  09/29/19  Yes  Roshe, Ermalinda Memos, MD              Earlyne Iba, MD

## 2019-09-28 NOTE — ED Triage Notes (Signed)
Patient returns to the ED for wound check to have pacing removed - from left buttocks.

## 2019-09-28 NOTE — ED Provider Notes (Signed)
29 year old who presents with a complaint of wound check, abscess packing removal.  Had an incision and drainage of an abscess 2 days ago with packing insertion.  Has been placed on antibiotics also.  She believes that if she got this infection from her boyfriend.  Denies any fevers or chills.  No further drainage noted.  Pain is much improved after incision and drainage.    The history is provided by the patient. No language interpreter was used.   Wound Check  This is a new problem. The current episode started less than 1 hour ago. The problem has been resolved. Pertinent negatives include no chest pain, no abdominal pain, no headaches and no shortness of breath. Nothing aggravates the symptoms. Nothing relieves the symptoms. She has tried nothing for the symptoms. The treatment provided no relief.        Past Medical History:   Diagnosis Date   ??? Abortion Sept 12th, 2020   ??? Anxiety        Past Surgical History:   Procedure Laterality Date   ??? HX GYN      vaginal birth 2 years ago         No family history on file.    Social History     Socioeconomic History   ??? Marital status: SINGLE     Spouse name: Not on file   ??? Number of children: Not on file   ??? Years of education: Not on file   ??? Highest education level: Not on file   Occupational History   ??? Not on file   Tobacco Use   ??? Smoking status: Never Smoker   ??? Smokeless tobacco: Never Used   Substance and Sexual Activity   ??? Alcohol use: Yes     Comment: socially   ??? Drug use: No   ??? Sexual activity: Not on file   Other Topics Concern   ??? Not on file   Social History Narrative   ??? Not on file     Social Determinants of Health     Financial Resource Strain:    ??? Difficulty of Paying Living Expenses:    Food Insecurity:    ??? Worried About Programme researcher, broadcasting/film/video in the Last Year:    ??? Barista in the Last Year:    Transportation Needs:    ??? Freight forwarder (Medical):    ??? Lack of Transportation (Non-Medical):    Physical Activity:    ??? Days of Exercise  per Week:    ??? Minutes of Exercise per Session:    Stress:    ??? Feeling of Stress :    Social Connections:    ??? Frequency of Communication with Friends and Family:    ??? Frequency of Social Gatherings with Friends and Family:    ??? Attends Religious Services:    ??? Database administrator or Organizations:    ??? Attends Engineer, structural:    ??? Marital Status:    Intimate Programme researcher, broadcasting/film/video Violence:    ??? Fear of Current or Ex-Partner:    ??? Emotionally Abused:    ??? Physically Abused:    ??? Sexually Abused:          ALLERGIES: Patient has no known allergies.    Review of Systems   Constitutional: Negative.  Negative for appetite change, chills and diaphoresis.   HENT: Negative.  Negative for dental problem, ear pain, facial swelling, hearing loss, postnasal drip, rhinorrhea  and sinus pain.    Eyes: Negative.  Negative for pain, redness and itching.   Respiratory: Negative.  Negative for choking, chest tightness and shortness of breath.    Cardiovascular: Negative.  Negative for chest pain and palpitations.   Gastrointestinal: Negative.  Negative for abdominal pain, constipation, diarrhea and nausea.   Endocrine: Negative.  Negative for heat intolerance, polydipsia and polyphagia.   Genitourinary: Negative.  Negative for dysuria, hematuria and urgency.   Musculoskeletal: Negative.  Negative for back pain, joint swelling, myalgias and neck pain.   Skin: Positive for wound.   Allergic/Immunologic: Negative.    Neurological: Negative.  Negative for headaches.   Hematological: Negative.    Psychiatric/Behavioral: Negative.  Negative for confusion and self-injury. The patient is not nervous/anxious and is not hyperactive.    All other systems reviewed and are negative.      Vitals:    09/28/19 1301 09/28/19 1302   BP:  99/65   Pulse:  94   Resp:  16   Temp:  98.2 ??F (36.8 ??C)   SpO2:  98%   Weight: 38.1 kg (84 lb)    Height: 5\' 3"  (1.6 m)             Physical Exam  Vitals and nursing note reviewed.   Constitutional:        Appearance: She is normal weight.   HENT:      Head: Normocephalic and atraumatic.      Nose: Nose normal.      Mouth/Throat:      Mouth: Mucous membranes are moist.      Pharynx: Oropharynx is clear.   Eyes:      Extraocular Movements: Extraocular movements intact.      Conjunctiva/sclera: Conjunctivae normal.      Pupils: Pupils are equal, round, and reactive to light.   Cardiovascular:      Rate and Rhythm: Normal rate and regular rhythm.   Pulmonary:      Effort: Pulmonary effort is normal.      Breath sounds: Normal breath sounds.   Abdominal:      General: Abdomen is flat. Bowel sounds are normal.   Skin:     General: Skin is warm.      Capillary Refill: Capillary refill takes less than 2 seconds.             Comments: Abscess cavity noted with packing, packing removed, no further purulence noted.   Neurological:      Mental Status: She is alert.   Psychiatric:         Mood and Affect: Mood normal.         Behavior: Behavior normal.          MDM  Number of Diagnoses or Management Options  Encounter for removal of abscess packing: established and improving  Visit for wound check: established and improving  Diagnosis management comments: 29 year old who presents with a complaint of wound check, packing removal.  Packing removed successfully, given home instructions on how to treat the open wound.  Discharged home with instructions to complete the oral antibiotics.       Amount and/or Complexity of Data Reviewed  Clinical lab tests: ordered and reviewed  Tests in the medicine section of CPT??: ordered and reviewed    Risk of Complications, Morbidity, and/or Mortality  Presenting problems: minimal  Diagnostic procedures: minimal  Management options: minimal    Patient Progress  Patient progress: stable  Procedures    <EMERGENCY DEPARTMENT CASE SUMMARY>    Impression/Differential Diagnosis: Abscess, cellulitis, wound care    Plan: Packing removal, reassess    ED Course: As above    Final Impression/Diagnosis:    1. Visit for wound check    2. Encounter for removal of abscess packing          Patient condition at time of disposition: Stable      I have reviewed the following home medications:    Prior to Admission medications    Medication Sig Start Date End Date Taking? Authorizing Provider   trimethoprim-sulfamethoxazole (Bactrim DS) 160-800 mg per tablet Take 1 Tab by mouth two (2) times a day.   Yes Other, Phys, MD   clindamycin (CLEOCIN) 300 mg capsule Take 1 Cap by mouth four (4) times daily for 10 days. 09/26/19 10/06/19 Yes Mikle Bosworth, MD   HYDROcodone-acetaminophen [facility]) 5-325 mg per tablet Take 1 Tab by mouth every six (6) hours as needed for Pain for up to 3 days. Max Daily Amount: 4 Tabs. 09/26/19 09/29/19 Yes Roshe, Ermalinda Memos, MD         Earlyne Iba, MD

## 2019-09-29 LAB — CULTURE, WOUND W GRAM STAIN

## 2019-10-01 LAB — CULTURE, ANAEROBIC

## 2019-12-14 ENCOUNTER — Emergency Department: Payer: MEDICAID | Primary: Family

## 2019-12-14 DIAGNOSIS — E860 Dehydration: Secondary | ICD-10-CM

## 2019-12-14 NOTE — ED Provider Notes (Signed)
This is a 29 y/o female w/ hx anxiety, who presents to ED w/c/o new onset of HA for 2 days. Pt describes her HA as " weird feeling in side my head". Sx associated by minimal nausea. No specific aggravating or alleviating. Pt says she under lost of stress but feels safe at home. Pt denies having any intention and plan to harm herself or others. Pt also c/o strong urinary smell and dry mouth. No associated focal numbness and weaknesses. No CP / dyspnea and heart palpitations      Head Pain   This is a new problem. The current episode started 2 days ago. The problem occurs constantly. The headache is aggravated by nausea. The pain is located in the parietal region. The pain is at a severity of 4/10. The pain is moderate. Pertinent negatives include no anorexia, no fever, no malaise/fatigue, no chest pressure, no near-syncope, no orthopnea, no palpitations, no syncope, no shortness of breath, no weakness, no tingling, no dizziness, no visual change, no nausea and no vomiting. She has tried nothing for the symptoms.        Past Medical History:   Diagnosis Date   ??? Abortion Sept 12th, 2020   ??? Anxiety        Past Surgical History:   Procedure Laterality Date   ??? HX GYN      vaginal birth 2 years ago         History reviewed. No pertinent family history.    Social History     Socioeconomic History   ??? Marital status: SINGLE     Spouse name: Not on file   ??? Number of children: Not on file   ??? Years of education: Not on file   ??? Highest education level: Not on file   Occupational History   ??? Not on file   Tobacco Use   ??? Smoking status: Never Smoker   ??? Smokeless tobacco: Never Used   Substance and Sexual Activity   ??? Alcohol use: Yes     Comment: socially   ??? Drug use: No   ??? Sexual activity: Not on file   Other Topics Concern   ??? Not on file   Social History Narrative   ??? Not on file     Social Determinants of Health     Financial Resource Strain:    ??? Difficulty of Paying Living Expenses:    Food Insecurity:    ??? Worried  About Charity fundraiser in the Last Year:    ??? Arboriculturist in the Last Year:    Transportation Needs:    ??? Film/video editor (Medical):    ??? Lack of Transportation (Non-Medical):    Physical Activity:    ??? Days of Exercise per Week:    ??? Minutes of Exercise per Session:    Stress:    ??? Feeling of Stress :    Social Connections:    ??? Frequency of Communication with Friends and Family:    ??? Frequency of Social Gatherings with Friends and Family:    ??? Attends Religious Services:    ??? Marine scientist or Organizations:    ??? Attends Music therapist:    ??? Marital Status:    Intimate Production manager Violence:    ??? Fear of Current or Ex-Partner:    ??? Emotionally Abused:    ??? Physically Abused:    ??? Sexually Abused:  ALLERGIES: Patient has no known allergies.    Review of Systems   Constitutional: Negative for chills, fever and malaise/fatigue.   HENT: Negative for sinus pressure and sore throat.    Eyes: Negative for visual disturbance.   Respiratory: Negative for cough and shortness of breath.    Cardiovascular: Negative for chest pain, palpitations, orthopnea, syncope and near-syncope.   Gastrointestinal: Negative for abdominal pain, anorexia, blood in stool, nausea and vomiting.   Endocrine: Negative for cold intolerance and heat intolerance.   Genitourinary: Negative for difficulty urinating and hematuria.   Musculoskeletal: Negative for back pain and neck stiffness.   Skin: Negative for rash.   Allergic/Immunologic: Negative for food allergies.   Neurological: Negative for dizziness, tingling, weakness and headaches.   Psychiatric/Behavioral: Negative for behavioral problems and sleep disturbance.       Vitals:    12/14/19 2030 12/14/19 2031   BP: 109/73    Pulse: (!) 110    Resp: 18    Temp: 98.1 ??F (36.7 ??C)    SpO2: 100%    Weight:  36.7 kg (81 lb)   Height:  _0  (1.6 m)            Physical Exam  Vitals and nursing note reviewed.   Constitutional:       General: She is in acute  distress.   HENT:      Head: Normocephalic and atraumatic.      Right Ear: Tympanic membrane normal.      Left Ear: Tympanic membrane normal.      Nose: Nose normal.      Mouth/Throat:      Mouth: Mucous membranes are moist.      Pharynx: No oropharyngeal exudate or posterior oropharyngeal erythema.   Eyes:      Extraocular Movements: Extraocular movements intact.      Pupils: Pupils are equal, round, and reactive to light.   Cardiovascular:      Rate and Rhythm: Normal rate and regular rhythm.      Pulses: Normal pulses.      Heart sounds: Normal heart sounds. No murmur heard.   No friction rub. No gallop.    Pulmonary:      Effort: Pulmonary effort is normal. No respiratory distress.      Breath sounds: Normal breath sounds. No stridor. No wheezing, rhonchi or rales.   Chest:      Chest wall: No tenderness.   Abdominal:      General: Abdomen is flat. Bowel sounds are normal. There is no distension.      Palpations: Abdomen is soft. There is no mass.      Tenderness: There is no abdominal tenderness. There is no guarding or rebound.      Hernia: No hernia is present.   Musculoskeletal:         General: Normal range of motion.      Cervical back: Normal range of motion and neck supple. No rigidity.   Lymphadenopathy:      Cervical: No cervical adenopathy.   Skin:     General: Skin is warm.      Capillary Refill: Capillary refill takes less than 2 seconds.   Neurological:      General: No focal deficit present.      Mental Status: She is alert and oriented to person, place, and time.          MDM       Procedures    <EMERGENCY DEPARTMENT CASE  SUMMARY>    Impression/Differential Diagnosis:Susepct anxiety and stress , r/o acute intracranial processes / dehydration / UTI and metabolic derangement     Plan: Based on my initial history, physical examination, this patient's past history and risk factors, we will obtain diagnostics and based on these and response to therapy will make a decision with best judgement to admit,  transfer or discharge this patient with close follow up.  My initial plan is likely to discharge       ED Course:     Recent Results (from the past 24 hour(s))   URINALYSIS W/ RFLX MICROSCOPIC    Collection Time: 12/14/19  8:54 PM   Result Value Ref Range    Color YELLOW YEL      Appearance CLEAR CLEAR      Specific gravity <1.005 (L) 1.005 - 1.030    pH (UA) 6.0 5.0 - 8.0      Protein Negative NEG mg/dL    Glucose Negative NEG mg/dL    Ketone Negative NEG mg/dL    Bilirubin Negative NEG      Blood TRACE (A) NEG      Urobilinogen 0.2 0.1 - 1.0 EU/dL    Nitrites Negative NEG      Leukocyte Esterase SMALL (A) NEG     HCG URINE, QL    Collection Time: 12/14/19  8:54 PM   Result Value Ref Range    HCG urine, QL Negative NEG     URINE MICROSCOPIC    Collection Time: 12/14/19  8:54 PM   Result Value Ref Range    WBC 5-10 0 - 3 /hpf    RBC 3-5 0 - 2 /hpf    Epithelial cells 10-20 0 - 10 /hpf    Bacteria TRACE (A) NONE /hpf    Casts NONE NONE /lpf    Crystals, urine NONE NONE /LPF   METABOLIC PANEL, COMPREHENSIVE    Collection Time: 12/14/19  9:01 PM   Result Value Ref Range    Sodium 138 136 - 145 mmol/L    Potassium 3.6 3.5 - 5.1 mmol/L    Chloride 103 98 - 107 mmol/L    CO2 28 21 - 32 mmol/L    Anion gap 11 8 - 20 mmol/L    Glucose 94 74 - 106 mg/dL    BUN 9 7 - 18 mg/dL    Creatinine 0.75 0.55 - 1.02 mg/dL    GFR est AA >60 >60 ml/min/1.52m    GFR est non-AA >60 >60 ml/min/1.736m   Calcium 9.3 8.5 - 10.1 mg/dL    Bilirubin, total 1.9 (H) 0.2 - 1.0 mg/dL    ALT (SGPT) 21 12 - 78 U/L    AST (SGOT) 17 15 - 37 U/L    Alk. phosphatase 51 46 - 116 U/L    Protein, total 8.6 (H) 6.4 - 8.2 g/dL    Albumin 4.7 3.4 - 5.0 g/dL    Globulin 3.9 2.5 - 5.0 g/dL    A-G Ratio 1.2 1.0 - 1.5     CBC WITH AUTOMATED DIFF    Collection Time: 12/14/19  9:01 PM   Result Value Ref Range    WBC 7.7 4.8 - 10.6 K/uL    RBC 4.05 (L) 4.20 - 5.40 M/uL    HGB 13.1 12.0 - 16.0 g/dL    HCT 39.6 36.0 - 47.0 %    MCV 97.8 (H) 81.0 - 94.0 FL    MCH 32.3  27.0 - 35.0 PG  MCHC 33.1 30.7 - 37.3 g/dL    RDW 11.9 11.5 - 14.0 %    PLATELET 233 130 - 400 K/uL    MPV 10.7 9.2 - 11.8 FL    NRBC 0.0 0.0 PER 100 WBC    ABSOLUTE NRBC 0.00 0.0 - 0.01 K/uL    NEUTROPHILS 53 48.0 - 72.0 %    LYMPHOCYTES 39 18.0 - 40.0 %    MONOCYTES 7 2.0 - 12.0 %    EOSINOPHILS 0 0.0 - 7.0 %    BASOPHILS 0 0.0 - 3.0 %    IMMATURE GRANULOCYTES 0 0 - 0.5 %    ABS. NEUTROPHILS 4.1 2.3 - 7.6 K/UL    ABS. LYMPHOCYTES 3.0 0.9 - 4.2 K/UL    ABS. MONOCYTES 0.6 0.1 - 1.7 K/UL    ABS. EOSINOPHILS 0.0 0.0 - 1.0 K/UL    ABS. BASOPHILS 0.0 0.0 - 0.4 K/UL    ABS. IMM. GRANS. 0.0 0.0 - 0.17 K/UL    DF AUTOMATED       Re: Exam:pt refused HCT  KY:HCWC:  Work up discussed in full detail. Pt feels better       Final Impression/Diagnosis:     ICD-10-CM ICD-9-CM    1. Dehydration  E86.0 276.51          Patient condition at time of disposition: stable      I have reviewed the following home medications:    Prior to Admission medications    Medication Sig Start Date End Date Taking? Authorizing Provider   trimethoprim-sulfamethoxazole (Bactrim DS) 160-800 mg per tablet Take 1 Tab by mouth two (2) times a day.    Other, Phys, MD         Donnamarie Rossetti, MD

## 2019-12-14 NOTE — ED Notes (Signed)
Pt is alert, oriented x 4, in no apparent distress, ambulatory with steady gait, verbalized understanding of discharged instructions and follow up

## 2019-12-14 NOTE — ED Notes (Signed)
Pt c/o pounding headache x 1 week, took tylenol yesterday which help relief the pain, denies any pain now, at the ED to get checked

## 2019-12-15 ENCOUNTER — Inpatient Hospital Stay
Admit: 2019-12-15 | Discharge: 2019-12-15 | Disposition: A | Discharge status: Home / Self Care | Payer: MEDICAID | Attending: Family Medicine

## 2019-12-15 LAB — URINALYSIS W/ RFLX MICROSCOPIC
Bilirubin, Urine: NEGATIVE
Bilirubin: NEGATIVE
Glucose, Ur: NEGATIVE mg/dL
Glucose: NEGATIVE mg/dL
Ketone: NEGATIVE mg/dL
Ketones, Urine: NEGATIVE mg/dL
Nitrite, Urine: NEGATIVE
Nitrites: NEGATIVE
Protein, UA: NEGATIVE mg/dL
Protein: NEGATIVE mg/dL
Specific Gravity, UA: <1.005 NA — ABNORMAL LOW (ref 1.005–1.030)
Specific gravity: <1.005 — ABNORMAL LOW (ref 1.005–1.030)
Urobilinogen, UA, POCT: 0.2 EU/dL (ref 0.1–1.0)
Urobilinogen: 0.2 EU/dL (ref 0.1–1.0)
pH (UA): 6 (ref 5.0–8.0)
pH, UA: 6 (ref 5.0–8.0)

## 2019-12-15 LAB — CBC WITH AUTO DIFFERENTIAL
Basophils %: 0 % (ref 0.0–3.0)
Basophils Absolute: 0 10*3/uL (ref 0.0–0.4)
Eosinophils %: 0 % (ref 0.0–7.0)
Eosinophils Absolute: 0 10*3/uL (ref 0.0–1.0)
Granulocyte Absolute Count: 0 10*3/uL (ref 0.0–0.17)
Hematocrit: 39.6 % (ref 36.0–47.0)
Hemoglobin: 13.1 g/dL (ref 12.0–16.0)
Immature Granulocytes: 0 % (ref 0–0.5)
Lymphocytes %: 39 % (ref 18.0–40.0)
Lymphocytes Absolute: 3 10*3/uL (ref 0.9–4.2)
MCH: 32.3 PG (ref 27.0–35.0)
MCHC: 33.1 g/dL (ref 30.7–37.3)
MCV: 97.8 FL — ABNORMAL HIGH (ref 81.0–94.0)
MPV: 10.7 FL (ref 9.2–11.8)
Monocytes %: 7 % (ref 2.0–12.0)
Monocytes Absolute: 0.6 10*3/uL (ref 0.1–1.7)
NRBC Absolute: 0 10*3/uL (ref 0.0–0.01)
Neutrophils %: 53 % (ref 48.0–72.0)
Neutrophils Absolute: 4.1 10*3/uL (ref 2.3–7.6)
Nucleated RBCs: 0 PER 100 WBC
Platelets: 233 10*3/uL (ref 130–400)
RBC: 4.05 M/uL — ABNORMAL LOW (ref 4.20–5.40)
RDW: 11.9 % (ref 11.5–14.0)
WBC: 7.7 10*3/uL (ref 4.8–10.6)

## 2019-12-15 LAB — COMPREHENSIVE METABOLIC PANEL
ALT: 21 U/L (ref 12–78)
AST: 17 U/L (ref 15–37)
Albumin/Globulin Ratio: 1.2 (ref 1.0–1.5)
Albumin: 4.7 g/dL (ref 3.4–5.0)
Alkaline Phosphatase: 51 U/L (ref 46–116)
Anion Gap: 11 mmol/L (ref 8–20)
BUN: 9 mg/dL (ref 7–18)
CO2: 28 mmol/L (ref 21–32)
Calcium: 9.3 mg/dL (ref 8.5–10.1)
Chloride: 103 mmol/L (ref 98–107)
Creatinine: 0.75 mg/dL (ref 0.55–1.02)
EGFR IF NonAfrican American: >60 mL/min/{1.73_m2} (ref >60)
GFR African American: >60 mL/min/{1.73_m2} (ref >60)
Globulin: 3.9 g/dL (ref 2.5–5.0)
Glucose: 94 mg/dL (ref 74–106)
Potassium: 3.6 mmol/L (ref 3.5–5.1)
Sodium: 138 mmol/L (ref 136–145)
Total Bilirubin: 1.9 mg/dL — ABNORMAL HIGH (ref 0.2–1.0)
Total Protein: 8.6 g/dL — ABNORMAL HIGH (ref 6.4–8.2)

## 2019-12-15 LAB — HCG URINE, QL
HCG urine, QL: NEGATIVE
Pregnancy Test(Urn): NEGATIVE

## 2019-12-15 LAB — URINE MICROSCOPIC

## 2019-12-15 LAB — CBC WITH AUTOMATED DIFF
ABS. BASOPHILS: 0 10*3/uL (ref 0.0–0.4)
ABS. EOSINOPHILS: 0 10*3/uL (ref 0.0–1.0)
ABS. IMM. GRANS.: 0 10*3/uL (ref 0.0–0.17)
ABS. LYMPHOCYTES: 3 10*3/uL (ref 0.9–4.2)
ABS. MONOCYTES: 0.6 10*3/uL (ref 0.1–1.7)
ABS. NEUTROPHILS: 4.1 10*3/uL (ref 2.3–7.6)
ABSOLUTE NRBC: 0 10*3/uL (ref 0.0–0.01)
BASOPHILS: 0 % (ref 0.0–3.0)
EOSINOPHILS: 0 % (ref 0.0–7.0)
HCT: 39.6 % (ref 36.0–47.0)
HGB: 13.1 g/dL (ref 12.0–16.0)
IMMATURE GRANULOCYTES: 0 % (ref 0–0.5)
LYMPHOCYTES: 39 % (ref 18.0–40.0)
MCH: 32.3 PG (ref 27.0–35.0)
MCHC: 33.1 g/dL (ref 30.7–37.3)
MCV: 97.8 FL — ABNORMAL HIGH (ref 81.0–94.0)
MONOCYTES: 7 % (ref 2.0–12.0)
MPV: 10.7 FL (ref 9.2–11.8)
NEUTROPHILS: 53 % (ref 48.0–72.0)
NRBC: 0 PER 100 WBC
PLATELET: 233 10*3/uL (ref 130–400)
RBC: 4.05 M/uL — ABNORMAL LOW (ref 4.20–5.40)
RDW: 11.9 % (ref 11.5–14.0)
WBC: 7.7 10*3/uL (ref 4.8–10.6)

## 2019-12-15 LAB — METABOLIC PANEL, COMPREHENSIVE
A-G Ratio: 1.2 (ref 1.0–1.5)
ALT (SGPT): 21 U/L (ref 12–78)
AST (SGOT): 17 U/L (ref 15–37)
Albumin: 4.7 g/dL (ref 3.4–5.0)
Alk. phosphatase: 51 U/L (ref 46–116)
Anion gap: 11 mmol/L (ref 8–20)
BUN: 9 mg/dL (ref 7–18)
Bilirubin, total: 1.9 mg/dL — ABNORMAL HIGH (ref 0.2–1.0)
CO2: 28 mmol/L (ref 21–32)
Calcium: 9.3 mg/dL (ref 8.5–10.1)
Chloride: 103 mmol/L (ref 98–107)
Creatinine: 0.75 mg/dL (ref 0.55–1.02)
GFR est AA: >60 mL/min/{1.73_m2} (ref >60)
GFR est non-AA: >60 mL/min/{1.73_m2} (ref >60)
Globulin: 3.9 g/dL (ref 2.5–5.0)
Glucose: 94 mg/dL (ref 74–106)
Potassium: 3.6 mmol/L (ref 3.5–5.1)
Protein, total: 8.6 g/dL — ABNORMAL HIGH (ref 6.4–8.2)
Sodium: 138 mmol/L (ref 136–145)

## 2019-12-15 MED ORDER — SODIUM CHLORIDE 0.9% BOLUS IV
0.9 % | Freq: Once | INTRAVENOUS | Status: AC
Start: 2019-12-15 — End: 2019-12-14
  Administered 2019-12-15: 01:00:00 via INTRAVENOUS

## 2019-12-15 MED FILL — SODIUM CHLORIDE 0.9 % IV: INTRAVENOUS | Qty: 1000

## 2020-05-16 ENCOUNTER — Emergency Department: Admit: 2020-05-17 | Payer: MEDICAID | Primary: Family

## 2020-05-16 ENCOUNTER — Inpatient Hospital Stay
Admit: 2020-05-16 | Discharge: 2020-05-17 | Disposition: A | Discharge status: Home / Self Care | Payer: MEDICAID | Attending: Family Medicine

## 2020-05-16 DIAGNOSIS — U071 COVID-19: Secondary | ICD-10-CM

## 2020-05-16 NOTE — ED Notes (Signed)
Cough 5 days  sts sore throat went away  No fever

## 2020-05-16 NOTE — ED Notes (Signed)
Patient is awake, alert, and oriented, speech is clear and patient is able to ambulate (if applicable) and ready for discharge. Verbal and written discharge instructions provided and has the cognitive understanding of discharge instructions. Discharged home with family. All questions answered. Pt dsicahrged  rx sent   Pt amb out of er

## 2020-05-16 NOTE — ED Provider Notes (Signed)
This is a 30 y/o female, who presents to ED w/c/o dry cough for 5 days. Pt says in the beginning she had sore throat which went away. Pt says she was tested neg for covid yesterday in [facility]. Pt denies having hemoptysis / CP / heart palpitations and diaphoresis. No change in taste and smell. No N/V/D/C/abd pain and hematochezia. Pt says she vaccinated against covid-19     The history is provided by the patient.   Cough  This is a recurrent problem. The current episode started more than 2 days ago. The problem occurs constantly. The cough is non-productive. There has been no fever. Pertinent negatives include no chest pain, no chills, no sweats, no weight loss, no eye redness, no ear congestion, no ear pain, no headaches, no rhinorrhea, no sore throat, no myalgias, no shortness of breath, no wheezing, no nausea, no vomiting and no confusion. She has tried nothing for the symptoms. Her past medical history does not include bronchitis, pneumonia, bronchiectasis, COPD, emphysema, asthma, cancer, heart failure or CHF.        Past Medical History:   Diagnosis Date   ??? Abortion Sept 12th, 2020   ??? Anxiety        Past Surgical History:   Procedure Laterality Date   ??? HX GYN      vaginal birth 2 years ago         History reviewed. No pertinent family history.    Social History     Socioeconomic History   ??? Marital status: SINGLE     Spouse name: Not on file   ??? Number of children: Not on file   ??? Years of education: Not on file   ??? Highest education level: Not on file   Occupational History   ??? Not on file   Tobacco Use   ??? Smoking status: Never Smoker   ??? Smokeless tobacco: Never Used   Substance and Sexual Activity   ??? Alcohol use: Yes     Comment: socially   ??? Drug use: No   ??? Sexual activity: Not on file   Other Topics Concern   ??? Not on file   Social History Narrative   ??? Not on file     Social Determinants of Health     Financial Resource Strain:    ??? Difficulty of Paying Living Expenses: Not on file   Food Insecurity:     ??? Worried About Running Out of Food in the Last Year: Not on file   ??? Ran Out of Food in the Last Year: Not on file   Transportation Needs:    ??? Lack of Transportation (Medical): Not on file   ??? Lack of Transportation (Non-Medical): Not on file   Physical Activity:    ??? Days of Exercise per Week: Not on file   ??? Minutes of Exercise per Session: Not on file   Stress:    ??? Feeling of Stress : Not on file   Social Connections:    ??? Frequency of Communication with Friends and Family: Not on file   ??? Frequency of Social Gatherings with Friends and Family: Not on file   ??? Attends Religious Services: Not on file   ??? Active Member of Clubs or Organizations: Not on file   ??? Attends Banker Meetings: Not on file   ??? Marital Status: Not on file   Intimate Partner Violence:    ??? Fear of Current or Ex-Partner: Not on file   ???  Emotionally Abused: Not on file   ??? Physically Abused: Not on file   ??? Sexually Abused: Not on file   Housing Stability:    ??? Unable to Pay for Housing in the Last Year: Not on file   ??? Number of Places Lived in the Last Year: Not on file   ??? Unstable Housing in the Last Year: Not on file         ALLERGIES: Patient has no known allergies.    Review of Systems   Constitutional: Negative for chills, fever and weight loss.   HENT: Negative for ear pain, rhinorrhea, sinus pressure and sore throat.    Eyes: Negative for redness and visual disturbance.   Respiratory: Positive for cough. Negative for shortness of breath and wheezing.    Cardiovascular: Negative for chest pain and palpitations.   Gastrointestinal: Negative for abdominal pain, blood in stool, nausea and vomiting.   Endocrine: Negative for cold intolerance and heat intolerance.   Genitourinary: Negative for difficulty urinating and hematuria.   Musculoskeletal: Negative for back pain, myalgias and neck stiffness.   Skin: Negative for rash.   Allergic/Immunologic: Negative for food allergies.   Neurological: Negative for dizziness and  headaches.   Psychiatric/Behavioral: Negative for behavioral problems, confusion and sleep disturbance.       Vitals:    05/16/20 1743 05/16/20 1750   BP: 105/64    Pulse: 90    Resp: 16    Temp: 98.4 ??F (36.9 ??C)    SpO2: 98% 98%   Weight: 39.9 kg (88 lb)    Height: 5\' 3"  (1.6 m)             Physical Exam  Vitals and nursing note reviewed.   Constitutional:       General: She is in acute distress.   HENT:      Head: Normocephalic and atraumatic.      Right Ear: Tympanic membrane normal.      Left Ear: Tympanic membrane normal.      Nose: Nose normal. No congestion or rhinorrhea.      Mouth/Throat:      Mouth: Mucous membranes are moist.      Pharynx: No oropharyngeal exudate or posterior oropharyngeal erythema.   Eyes:      General: No scleral icterus.        Right eye: No discharge.         Left eye: No discharge.      Extraocular Movements: Extraocular movements intact.      Conjunctiva/sclera: Conjunctivae normal.      Pupils: Pupils are equal, round, and reactive to light.   Neck:      Vascular: No carotid bruit.   Cardiovascular:      Rate and Rhythm: Normal rate and regular rhythm.      Pulses: Normal pulses.      Heart sounds: Normal heart sounds. No murmur heard.  No friction rub.   Pulmonary:      Effort: Pulmonary effort is normal. No respiratory distress.      Breath sounds: Normal breath sounds. No stridor. No wheezing, rhonchi or rales.   Chest:      Chest wall: No tenderness.   Abdominal:      General: Abdomen is flat.      Palpations: Abdomen is soft.   Musculoskeletal:         General: Normal range of motion.      Cervical back: Normal range of motion and neck supple.  No rigidity or tenderness.   Lymphadenopathy:      Cervical: No cervical adenopathy.   Skin:     General: Skin is warm.      Capillary Refill: Capillary refill takes less than 2 seconds.   Neurological:      General: No focal deficit present.      Mental Status: She is alert.          MDM       Procedures      <EMERGENCY DEPARTMENT  CASE SUMMARY>    Impression/Differential Diagnosis: Suspect bronchitis, r/o covid infection and PNA     Plan: as order     ED Course: exam    Final Impression/Diagnosis:     ICD-10-CM ICD-9-CM    1. Acute bronchitis, unspecified organism  J20.9 466.0          Patient condition at time of disposition: stable      I have reviewed the following home medications:    Prior to Admission medications    Medication Sig Start Date End Date Taking? Authorizing Provider   guaiFENesin (ROBITUSSIN) 100 mg/5 mL liquid Take 200 mg by mouth three (3) times daily as needed for Cough.   Yes Other, Phys, MD   acetaminophen (TylenoL) 325 mg tablet Take  by mouth every four (4) hours as needed for Pain.   Yes Other, Phys, MD   azithromycin (ZITHROMAX) 250 mg tablet UAD 05/16/20 05/21/20 Yes Florian Buff, MD   benzonatate (Tessalon Perles) 100 mg capsule Take 1 Capsule by mouth three (3) times daily as needed for Cough for up to 7 days. 05/16/20 05/23/20 Yes Florian Buff, MD         Florian Buff, MD

## 2020-05-16 NOTE — ED Notes (Signed)
Pt sts took covid test yesterday and it was negative

## 2020-05-17 MED ORDER — AZITHROMYCIN 250 MG TAB
250 mg | ORAL_TABLET | ORAL | 0 refills | Status: AC
Start: 2020-05-17 — End: 2020-05-21

## 2020-05-17 MED ORDER — BENZONATATE 100 MG CAP
100 mg | ORAL_CAPSULE | Freq: Three times a day (TID) | ORAL | 0 refills | Status: AC | PRN
Start: 2020-05-17 — End: 2020-05-23

## 2020-05-17 MED ORDER — AZITHROMYCIN 250 MG TAB
250 mg | ORAL | Status: AC
Start: 2020-05-17 — End: 2020-05-16
  Administered 2020-05-17: 02:00:00 via ORAL

## 2020-05-17 MED FILL — AZITHROMYCIN 250 MG TAB: 250 mg | ORAL | Qty: 2

## 2020-05-18 LAB — COVID-19, NP
SARS-CoV-2: DETECTED — AB
SARS-CoV-2: DETECTED — AB

## 2021-09-17 ENCOUNTER — Inpatient Hospital Stay
Admit: 2021-09-17 | Discharge: 2021-09-17 | Disposition: A | Discharge status: Home / Self Care | Payer: MEDICAID | Attending: Internal Medicine

## 2021-09-17 DIAGNOSIS — E860 Dehydration: Secondary | ICD-10-CM

## 2021-09-17 DIAGNOSIS — R630 Anorexia: Secondary | ICD-10-CM

## 2021-09-17 LAB — COMPREHENSIVE METABOLIC PANEL
ALT: 17 U/L (ref 12–78)
AST: 17 U/L (ref 15–37)
Albumin/Globulin Ratio: 1.1 (ref 1.0–1.5)
Albumin: 4 g/dL (ref 3.4–5.0)
Alkaline Phosphatase: 50 U/L (ref 46–116)
Anion Gap: 9 mmol/L (ref 8–20)
BUN: 8 mg/dL (ref 7–18)
CO2: 30 mmol/L (ref 21–32)
Calcium: 9.3 mg/dL (ref 8.5–10.1)
Chloride: 105 mmol/L (ref 98–107)
Creatinine: 0.84 mg/dL (ref 0.55–1.02)
EGFR IF NonAfrican American: >60 mL/min/{1.73_m2} (ref >60)
GFR African American: >60 mL/min/{1.73_m2} (ref >60)
Globulin: 3.6 g/dL (ref 2.5–5.0)
Glucose: 104 mg/dL (ref 74–106)
Potassium: 3.9 mmol/L (ref 3.5–5.1)
Sodium: 140 mmol/L (ref 136–145)
Total Bilirubin: 1.2 mg/dL — ABNORMAL HIGH (ref 0.2–1.0)
Total Protein: 7.6 g/dL (ref 6.4–8.2)

## 2021-09-17 LAB — CBC WITH AUTO DIFFERENTIAL
Basophils %: 0 % (ref 0.0–3.0)
Basophils Absolute: 0 10*3/uL (ref 0.0–0.4)
Eosinophils %: 0 % (ref 0.0–7.0)
Eosinophils Absolute: 0 10*3/uL (ref 0.0–1.0)
Granulocyte Absolute Count: 0 10*3/uL (ref 0.0–0.17)
Hematocrit: 35.5 % — ABNORMAL LOW (ref 36.0–47.0)
Hemoglobin: 11.9 g/dL — ABNORMAL LOW (ref 12.0–16.0)
Immature Granulocytes: 0 % (ref 0–0.5)
Lymphocytes %: 28 % (ref 18.0–40.0)
Lymphocytes Absolute: 1.8 10*3/uL (ref 0.9–4.2)
MCH: 31.9 PG (ref 27.0–35.0)
MCHC: 33.5 g/dL (ref 30.7–37.3)
MCV: 95.2 FL — ABNORMAL HIGH (ref 81.0–94.0)
MPV: 10.9 FL (ref 9.2–11.8)
Monocytes %: 7 % (ref 2.0–12.0)
Monocytes Absolute: 0.4 10*3/uL (ref 0.1–1.7)
NRBC Absolute: 0 10*3/uL (ref 0.0–0.01)
Neutrophils %: 64 % (ref 48.0–72.0)
Neutrophils Absolute: 4.1 10*3/uL (ref 2.3–7.6)
Nucleated RBCs: 0 PER 100 WBC
Platelets: 215 10*3/uL (ref 130–400)
RBC: 3.73 M/uL — ABNORMAL LOW (ref 4.20–5.40)
RDW: 11.3 % — ABNORMAL LOW (ref 11.5–14.0)
WBC: 6.3 10*3/uL (ref 4.8–10.6)

## 2021-09-17 LAB — URINALYSIS W/ RFLX MICROSCOPIC
Bilirubin, Urine: NEGATIVE
Bilirubin: NEGATIVE
Blood, Urine: NEGATIVE
Blood: NEGATIVE
Glucose, Ur: NEGATIVE mg/dL
Glucose: NEGATIVE mg/dL
Ketone: 15 mg/dL — AB
Ketones, Urine: 15 mg/dL — AB
Leukocyte Esterase, Urine: NEGATIVE
Leukocyte Esterase: NEGATIVE
Nitrite, Urine: NEGATIVE
Nitrites: NEGATIVE
Protein, UA: NEGATIVE mg/dL
Protein: NEGATIVE mg/dL
Specific Gravity, UA: 1.015 (ref 1.005–1.030)
Specific gravity: 1.015 (ref 1.005–1.030)
Urobilinogen, UA, POCT: 1 EU/dL (ref 0.1–1.0)
Urobilinogen: 1 EU/dL (ref 0.1–1.0)
pH (UA): 7 (ref 5.0–8.0)
pH, UA: 7 (ref 5.0–8.0)

## 2021-09-17 LAB — HCG URINE, QL
HCG urine, QL: NEGATIVE
Pregnancy Test(Urn): NEGATIVE

## 2021-09-17 LAB — MAGNESIUM
Magnesium: 1.9 mg/dL (ref 1.8–2.4)
Magnesium: 1.9 mg/dL (ref 1.8–2.4)

## 2021-09-17 LAB — METABOLIC PANEL, COMPREHENSIVE
A-G Ratio: 1.1 (ref 1.0–1.5)
ALT (SGPT): 17 U/L (ref 12–78)
AST (SGOT): 17 U/L (ref 15–37)
Albumin: 4 g/dL (ref 3.4–5.0)
Alk. phosphatase: 50 U/L (ref 46–116)
Anion gap: 9 mmol/L (ref 8–20)
BUN: 8 mg/dL (ref 7–18)
Bilirubin, total: 1.2 mg/dL — ABNORMAL HIGH (ref 0.2–1.0)
CO2: 30 mmol/L (ref 21–32)
Calcium: 9.3 mg/dL (ref 8.5–10.1)
Chloride: 105 mmol/L (ref 98–107)
Creatinine: 0.84 mg/dL (ref 0.55–1.02)
GFR est AA: >60 mL/min/{1.73_m2} (ref >60)
GFR est non-AA: >60 mL/min/{1.73_m2} (ref >60)
Globulin: 3.6 g/dL (ref 2.5–5.0)
Glucose: 104 mg/dL (ref 74–106)
Potassium: 3.9 mmol/L (ref 3.5–5.1)
Protein, total: 7.6 g/dL (ref 6.4–8.2)
Sodium: 140 mmol/L (ref 136–145)

## 2021-09-17 LAB — CBC WITH AUTOMATED DIFF
ABS. BASOPHILS: 0 10*3/uL (ref 0.0–0.4)
ABS. EOSINOPHILS: 0 10*3/uL (ref 0.0–1.0)
ABS. IMM. GRANS.: 0 10*3/uL (ref 0.0–0.17)
ABS. LYMPHOCYTES: 1.8 10*3/uL (ref 0.9–4.2)
ABS. MONOCYTES: 0.4 10*3/uL (ref 0.1–1.7)
ABS. NEUTROPHILS: 4.1 10*3/uL (ref 2.3–7.6)
ABSOLUTE NRBC: 0 10*3/uL (ref 0.0–0.01)
BASOPHILS: 0 % (ref 0.0–3.0)
EOSINOPHILS: 0 % (ref 0.0–7.0)
HCT: 35.5 % — ABNORMAL LOW (ref 36.0–47.0)
HGB: 11.9 g/dL — ABNORMAL LOW (ref 12.0–16.0)
IMMATURE GRANULOCYTES: 0 % (ref 0–0.5)
LYMPHOCYTES: 28 % (ref 18.0–40.0)
MCH: 31.9 PG (ref 27.0–35.0)
MCHC: 33.5 g/dL (ref 30.7–37.3)
MCV: 95.2 FL — ABNORMAL HIGH (ref 81.0–94.0)
MONOCYTES: 7 % (ref 2.0–12.0)
MPV: 10.9 FL (ref 9.2–11.8)
NEUTROPHILS: 64 % (ref 48.0–72.0)
NRBC: 0 PER 100 WBC
PLATELET: 215 10*3/uL (ref 130–400)
RBC: 3.73 M/uL — ABNORMAL LOW (ref 4.20–5.40)
RDW: 11.3 % — ABNORMAL LOW (ref 11.5–14.0)

## 2021-09-17 MED ORDER — SODIUM CHLORIDE 0.9% BOLUS IV
0.9 % | Freq: Once | INTRAVENOUS | Status: AC
Start: 2021-09-17 — End: 2021-09-17
  Administered 2021-09-17: 22:00:00 via INTRAVENOUS

## 2021-09-17 MED FILL — SODIUM CHLORIDE 0.9 % IV: INTRAVENOUS | Qty: 1000

## 2021-09-17 NOTE — ED Provider Notes (Signed)
ED Provider Notes by Darrol Angel, NP at 09/17/21 1721                Author: Darrol Angel, NP  Service: MEDICINE  Author Type: Nurse Practitioner       Filed: 09/17/21 1859  Date of Service: 09/17/21 1721  Status: Attested           Editor: Darrol Angel, NP (Nurse Practitioner)  Cosigner: Winfred Burn, DO at 09/17/21 1921          Attestation signed by Winfred Burn, DO at 09/17/21 1921          I was personally available for consultation in the emergency department.  I have reviewed the chart and agree with the documentation recorded by the midlevel  provider, including the assessment, treatment plan, and disposition.      Winfred Burn, DO                                 31 yo female who presents with c/o decrease appetite and weakness onset 4 weeks ago. No SOB,chest pain,chest tightness  or palpitations. No lightheadedness or dizziness. No syncope. No nasal congestion or cough. No abdominal pain. No n/v/d. No flank pain. No vaginal pain or discharge. No odor. She did report eating meals,but less than normal   No appetite. No SI. No plan.    Deferred pregnancy    Past Medical History:   Sept 12th, 2020: Abortion   No date: Anxiety         The history is provided by the patient.    Lethargy   This  is a new problem. The current episode started more than 1 week ago.  The problem occurs constantly. The problem has not changed since onset.Pertinent negatives include no chest pain, no abdominal pain, no headaches and no  shortness of breath. Nothing aggravates the symptoms. Nothing relieves the symptoms. She has tried nothing for the symptoms.          Past Medical History:        Diagnosis  Date         ?  Abortion  Sept 12th, 2020         ?  Anxiety               Past Surgical History:         Procedure  Laterality  Date          ?  HX GYN              vaginal birth 2 years ago             History reviewed. No pertinent family history.        Social History          Socioeconomic History         ?   Marital status:  SINGLE              Spouse name:  Not on file         ?  Number of children:  Not on file     ?  Years of education:  Not on file     ?  Highest education level:  Not on file       Occupational History        ?  Not on file       Tobacco Use         ?  Smoking status:  Never     ?  Smokeless tobacco:  Never       Substance and Sexual Activity         ?  Alcohol use:  Yes             Comment: socially         ?  Drug use:  No     ?  Sexual activity:  Not on file        Other Topics  Concern        ?  Not on file       Social History Narrative        ?  Not on file          Social Determinants of Health          Financial Resource Strain: Not on file     Food Insecurity: Not on file     Transportation Needs: Not on file     Physical Activity: Not on file     Stress: Not on file     Social Connections: Not on file     Intimate Partner Violence: Not on file       Housing Stability: Not on file              ALLERGIES: Patient has no known allergies.      Review of Systems    Constitutional:  Positive for appetite change. Negative for activity change, chills, diaphoresis, fatigue, fever and unexpected weight change.     HENT: Negative.      Respiratory: Negative.  Negative for shortness of breath.     Cardiovascular: Negative.  Negative for chest pain.    Gastrointestinal: Negative.  Negative for abdominal pain.    Genitourinary: Negative.     Neurological:  Positive for weakness. Negative for headaches.    All other systems reviewed and are negative.        Vitals:           09/17/21 1657  09/17/21 1657         BP:  105/74       Pulse:  (!) 110       Resp:  18       Temp:  98.8 F (37.1 C)       SpO2:  97%       Weight:    39 kg (86 lb)         Height:    '5\' 3"'  (1.6 m)                Physical Exam   Vitals and nursing note reviewed.    Constitutional:        General: She is not in acute distress.      Appearance: Normal appearance. She is underweight. She is not ill-appearing, toxic-appearing or  diaphoretic.    HENT:       Head: Normocephalic and atraumatic.       Right Ear: Ear canal normal.       Left Ear: Ear canal normal.       Nose: Nose normal.       Mouth/Throat:       Pharynx: Oropharynx is clear.    Eyes:       Extraocular Movements: Extraocular movements intact.       Conjunctiva/sclera: Conjunctivae normal.       Pupils: Pupils are equal, round, and reactive to light.  Cardiovascular:       Rate and Rhythm: Normal rate.       Heart sounds: No murmur heard.     No friction rub. No gallop.    Pulmonary:       Effort: Pulmonary effort is normal. No respiratory distress.       Breath sounds: Normal breath sounds. No stridor. No wheezing, rhonchi or rales.    Chest:       Chest wall: No tenderness.    Abdominal:       General: Bowel sounds are normal. There is no distension.       Palpations: Abdomen is soft. There is no mass.       Tenderness: There is no abdominal tenderness. There is no right CVA tenderness, left CVA tenderness, guarding or rebound.       Hernia: No hernia is present.     Musculoskeletal:       Cervical back: Full passive range of motion without pain and normal range of motion.       Right lower leg: No edema.       Left lower leg: No edema.    Skin:      General: Skin is warm and dry.    Neurological:       General: No focal deficit present.       Mental Status: She is alert and oriented to person, place, and time.       GCS: GCS eye subscore is 4. GCS verbal subscore is 5 . GCS motor subscore is 6.       Cranial Nerves: Cranial nerves 2-12 are intact.       Sensory: Sensation is intact.       Motor: Motor function is intact.       Coordination: Coordination is intact. Finger-Nose-Finger Test normal.       Gait: Gait is intact.    Psychiatric:          Attention and Perception: Attention normal.          Mood and Affect: Affect is flat.          Speech: Speech normal.          Behavior: Behavior is cooperative.           Medical Decision Making   Amount  and/or Complexity of  Data Reviewed   Labs: ordered. Decision-making details documented in ED Course.                Procedures                        Recent Results (from the past 12 hour(s))     CBC WITH AUTOMATED DIFF          Collection Time: 09/17/21  5:00 PM         Result  Value  Ref Range            WBC  6.3  4.8 - 10.6 K/uL       RBC  3.73 (L)  4.20 - 5.40 M/uL       HGB  11.9 (L)  12.0 - 16.0 g/dL       HCT  35.5 (L)  36.0 - 47.0 %       MCV  95.2 (H)  81.0 - 94.0 FL       MCH  31.9  27.0 - 35.0 PG       MCHC  33.5  30.7 - 37.3 g/dL       RDW  11.3 (L)  11.5 - 14.0 %       PLATELET  215  130 - 400 K/uL       MPV  10.9  9.2 - 11.8 FL       NRBC  0.0  0.0 PER 100 WBC       ABSOLUTE NRBC  0.00  0.0 - 0.01 K/uL       NEUTROPHILS  64  48.0 - 72.0 %       LYMPHOCYTES  28  18.0 - 40.0 %       MONOCYTES  7  2.0 - 12.0 %       EOSINOPHILS  0  0.0 - 7.0 %       BASOPHILS  0  0.0 - 3.0 %       IMMATURE GRANULOCYTES  0  0 - 0.5 %       ABS. NEUTROPHILS  4.1  2.3 - 7.6 K/UL       ABS. LYMPHOCYTES  1.8  0.9 - 4.2 K/UL       ABS. MONOCYTES  0.4  0.1 - 1.7 K/UL       ABS. EOSINOPHILS  0.0  0.0 - 1.0 K/UL       ABS. BASOPHILS  0.0  0.0 - 0.4 K/UL       ABS. IMM. GRANS.  0.0  0.0 - 0.17 K/UL       DF  AUTOMATED          METABOLIC PANEL, COMPREHENSIVE          Collection Time: 09/17/21  5:00 PM         Result  Value  Ref Range            Sodium  140  136 - 145 mmol/L       Potassium  3.9  3.5 - 5.1 mmol/L       Chloride  105  98 - 107 mmol/L       CO2  30  21 - 32 mmol/L       Anion gap  9  8 - 20 mmol/L       Glucose  104  74 - 106 mg/dL       BUN  8  7 - 18 mg/dL       Creatinine  0.84  0.55 - 1.02 mg/dL       GFR est AA  >60  >60 ml/min/1.55m       GFR est non-AA  >60  >60 ml/min/1.745m      Calcium  9.3  8.5 - 10.1 mg/dL       Bilirubin, total  1.2 (H)  0.2 - 1.0 mg/dL       ALT (SGPT)  17  12 - 78 U/L       AST (SGOT)  17  15 - 37 U/L       Alk. phosphatase  50  46 - 116 U/L       Protein, total  7.6  6.4 - 8.2 g/dL       Albumin  4.0  3.4  - 5.0 g/dL       Globulin  3.6  2.5 - 5.0 g/dL       A-G Ratio  1.1  1.0 - 1.5         MAGNESIUM  Collection Time: 09/17/21  5:00 PM         Result  Value  Ref Range            Magnesium  1.9  1.8 - 2.4 mg/dL       URINALYSIS W/ RFLX MICROSCOPIC          Collection Time: 09/17/21  5:20 PM         Result  Value  Ref Range            Color  YELLOW  YEL         Appearance  CLEAR  CLEAR         Specific gravity  1.015  1.005 - 1.030         pH (UA)  7.0  5.0 - 8.0         Protein  Negative  NEG mg/dL       Glucose  Negative  NEG mg/dL       Ketone  15 (A)  NEG mg/dL       Bilirubin  Negative  NEG         Blood  Negative  NEG         Urobilinogen  1.0  0.1 - 1.0 EU/dL       Nitrites  Negative  NEG         Leukocyte Esterase  Negative  NEG         HCG URINE, QL          Collection Time: 09/17/21  5:20 PM         Result  Value  Ref Range            HCG urine, QL  Negative  NEG                   Visit Vitals      BP  105/74     Pulse  (!) 110     Temp  98.8 F (37.1 C)     Resp  18     Ht  '5\' 3"'  (1.6 m)     Wt  39 kg (86 lb)     SpO2  97%        BMI  15.23 kg/m     Visit Vitals      BP  100/69     Pulse  84     Temp  98.8 F (37.1 C)     Resp  16     Ht  '5\' 3"'  (1.6 m)     Wt  39 kg (86 lb)     SpO2  96%        BMI  15.23 kg/m           <EMERGENCY DEPARTMENT CASE SUMMARY>      Impression/Differential Diagnosis: decrease appetite, weakness   Differential dxes: dehydration, electrolyte imbalance, pregnancy, UTI underweight, others       Plan: exam, labs, IV fluids, UA      ED Course: exam-no abdominal pain or tenderness   No flank pain    No SOB, lungs clear. No respiratory distress   Decrease appetite   Flat affect   No SI> no plan   Has not followed up with PCP yet   Labs ordered and reviewed    UA and UPT sent   UA-negative for nitrites and leukocytes    UPT-negative   CBC-H+H 11.9 35.5 (had been low in the past )  Total bili 1.3 (was 1.9 2021)   CMP-nl   Afebrile   No abdominal pain or tenderness on  examination   Onset over 4 weeks   No indication for CT scan or [place]    Advised to follow-up with PCP and GI    Discussed urgent s/s and when to return to ED   No SOB,chest pain, or palpitation. No cough, nasal congestion. No indication for CXR   Advised to increase fluids to stay hydrated      Final Impression/Diagnosis:  dehydration, underweight, decrease appetite, weakness      Patient condition at time of disposition:  stable         Small, frequent meals   Follow-up with PCP in 2-3 days and as needed   Follow-up with GI in 3-5 days and as needed   Return if any abdominal pain, chest pain, or any new issues or concerns       I have reviewed the following home medications:        Prior to Admission medications             Medication  Sig  Start Date  End Date  Taking?  Authorizing Provider            guaiFENesin (ROBITUSSIN) 100 mg/5 mL liquid  Take 200 mg by mouth three (3) times daily as needed for Cough.   Patient not taking: Reported on 09/17/2021        Other, Phys, MD            acetaminophen (TYLENOL) 325 mg tablet  Take  by mouth every four (4) hours as needed for Pain.   Patient not taking: Reported on 09/17/2021        Other, Phys, MD              Darrol Angel, NP

## 2021-09-17 NOTE — ED Notes (Signed)
ED Notes by  Pricilla Loveless, RN at 09/17/21 1904                Author: Pricilla Loveless, RN  Service: --  Author Type: Registered Nurse       Filed: 09/17/21 1904  Date of Service: 09/17/21 1904  Status: Signed          Editor: Pricilla Loveless, RN (Registered Nurse)               I have reviewed discharge instructions with the patient.  The patient verbalized understanding.

## 2021-09-17 NOTE — ED Notes (Signed)
ED Triage Notes by Pricilla Loveless, RN at 09/17/21 1658                Author: Pricilla Loveless, RN  Service: --  Author Type: Registered Nurse       Filed: 09/17/21 1700  Date of Service: 09/17/21 1658  Status: Signed          Editor: Pricilla Loveless, RN (Registered Nurse)               Pt comes to Ed with c/o weakness for 1 week with decreased food intake. Denies nausea/vomiting/diarrhea. Last BM Sunday. LMP April 25-29th
